# Patient Record
Sex: Male | Born: 1990 | Race: White | Hispanic: No | Marital: Married | Smoking: Former smoker
Health system: Southern US, Community
[De-identification: ages and names within clinical notes are randomized; demographics above are authoritative.]

## PROBLEM LIST (undated history)

## (undated) DIAGNOSIS — I8393 Asymptomatic varicose veins of bilateral lower extremities: Secondary | ICD-10-CM

## (undated) HISTORY — PX: APPENDECTOMY: SHX54

---
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
## 2010-06-30 ENCOUNTER — Observation Stay: Payer: Self-pay | Admitting: Surgery

## 2019-11-14 ENCOUNTER — Other Ambulatory Visit (INDEPENDENT_AMBULATORY_CARE_PROVIDER_SITE_OTHER): Payer: Self-pay | Admitting: Nurse Practitioner

## 2019-11-14 DIAGNOSIS — I83819 Varicose veins of unspecified lower extremities with pain: Secondary | ICD-10-CM

## 2019-11-22 ENCOUNTER — Ambulatory Visit (INDEPENDENT_AMBULATORY_CARE_PROVIDER_SITE_OTHER): Payer: BC Managed Care – PPO

## 2019-11-22 ENCOUNTER — Encounter (INDEPENDENT_AMBULATORY_CARE_PROVIDER_SITE_OTHER): Payer: Self-pay | Admitting: Nurse Practitioner

## 2019-11-22 ENCOUNTER — Other Ambulatory Visit: Payer: Self-pay

## 2019-11-22 ENCOUNTER — Ambulatory Visit (INDEPENDENT_AMBULATORY_CARE_PROVIDER_SITE_OTHER): Payer: BC Managed Care – PPO | Admitting: Nurse Practitioner

## 2019-11-22 VITALS — BP 110/73 | HR 92 | Ht 73.0 in | Wt 270.0 lb

## 2019-11-22 DIAGNOSIS — I83819 Varicose veins of unspecified lower extremities with pain: Secondary | ICD-10-CM | POA: Diagnosis not present

## 2019-11-23 ENCOUNTER — Encounter (INDEPENDENT_AMBULATORY_CARE_PROVIDER_SITE_OTHER): Payer: Self-pay | Admitting: Nurse Practitioner

## 2019-11-23 NOTE — Progress Notes (Signed)
Subjective:    Patient ID: Blake Perez, male    DOB: 19-Feb-1991, 29 y.o.   MRN: [id number] Chief Complaint  Patient presents with  . New Patient (Initial Visit)    Varicose Veins BLE VEN Reflux    The patient is seen for evaluation of symptomatic varicose veins.  The patient states that he has had them for about 9 or 10 years.  The patient relates burning and stinging which worsened steadily throughout the course of the day, particularly with standing. The patient also notes an aching and throbbing pain over the varicosities, particularly with prolonged dependent positions. The symptoms are significantly improved with elevation.  The patient also notes that during hot weather the symptoms are greatly intensified. The patient states the pain from the varicose veins interferes with work, daily exercise, shopping and household maintenance. At this point, the symptoms are persistent and severe enough that they're having a negative impact on lifestyle and are interfering with daily activities.  There is no history of DVT, PE or superficial thrombophlebitis. There is no history of ulceration or hemorrhage. The patient denies a significant family history of varicose veins.   The patient has not worn graduated compression in the past. At the present time the patient has not been using over-the-counter analgesics. There is no history of prior surgical intervention or sclerotherapy.  Today the patient underwent noninvasive studies that showed that the patient has no evidence of DVT or superficial venous thrombosis bilaterally.  The patient has no evidence of superficial venous reflux seen in the bilateral great saphenous veins or small saphenous veins.  The patient has no evidence of deep venous insufficiency bilaterally.  The varicose veins in the right leg laterally has no evidence of clot seen in the varicosities.   Review of Systems  Cardiovascular: Positive for leg swelling.       Tender  varicose veins  All other systems reviewed and are negative.      Objective:   Physical Exam Vitals reviewed.  Constitutional:      Appearance: Normal appearance.  HENT:     Head: Normocephalic.  Cardiovascular:     Rate and Rhythm: Normal rate and regular rhythm.     Pulses: Normal pulses.     Heart sounds: Normal heart sounds.     Comments: Prominent varicose veins especially on right lateral lower extremity extending from his proximal thigh to his proximal calf measures at least 3 to 4 mm Pulmonary:     Effort: Pulmonary effort is normal.     Breath sounds: Normal breath sounds.  Musculoskeletal:        General: Tenderness present.     Right lower leg: Edema present.     Left lower leg: Edema present.  Skin:    General: Skin is warm and dry.     Capillary Refill: Capillary refill takes less than 2 seconds.  Neurological:     Mental Status: He is alert and oriented to person, place, and time.  Psychiatric:        Mood and Affect: Mood normal.        Behavior: Behavior normal.        Thought Content: Thought content normal.        Judgment: Judgment normal.     BP 110/73   Pulse 92   Ht 6\' 1"  (1.854 m)   Wt 270 lb (122.5 kg)   BMI 35.62 kg/m   No past medical history on file.  Social History  Socioeconomic History  . Marital status: Single    Spouse name: Not on file  . Number of children: Not on file  . Years of education: Not on file  . Highest education level: Not on file  Occupational History  . Not on file  Tobacco Use  . Smoking status: Former Research scientist (life sciences)  . Smokeless tobacco: Former Network engineer and Sexual Activity  . Alcohol use: Not on file  . Drug use: Not on file  . Sexual activity: Not on file  Other Topics Concern  . Not on file  Social History Narrative  . Not on file   Social Determinants of Health   Financial Resource Strain:   . Difficulty of Paying Living Expenses:   Food Insecurity:   . Worried About Charity fundraiser in  the Last Year:   . Arboriculturist in the Last Year:   Transportation Needs:   . Film/video editor (Medical):   Marland Kitchen Lack of Transportation (Non-Medical):   Physical Activity:   . Days of Exercise per Week:   . Minutes of Exercise per Session:   Stress:   . Feeling of Stress :   Social Connections:   . Frequency of Communication with Friends and Family:   . Frequency of Social Gatherings with Friends and Family:   . Attends Religious Services:   . Active Member of Clubs or Organizations:   . Attends Archivist Meetings:   Marland Kitchen Marital Status:   Intimate Partner Violence:   . Fear of Current or Ex-Partner:   . Emotionally Abused:   Marland Kitchen Physically Abused:   . Sexually Abused:     History reviewed. No pertinent surgical history.  No family history on file.  Not on File     Assessment & Plan:   1. Varicose veins with pain Recommend:  The patient is complaining of varicose veins.    I have had a long discussion with the patient regarding  varicose veins and why they cause symptoms.  Patient will begin wearing graduated compression stockings on a daily basis, beginning first thing in the morning and removing them in the evening. The patient is instructed specifically not to sleep in the stockings.    The patient  will also begin using over-the-counter analgesics such as Motrin 600 mg po TID to help control the symptoms as needed.    In addition, behavioral modification including elevation during the day will be initiated, utilizing a recliner was recommended.  The patient is also instructed to continue exercising such as walking 4-5 times per week.  At this time the patient wishes to continue conservative therapy and based on noninvasive studies is not a candidate for endovenous laser ablation.  Sclerotherapy may be useful however we are unable to perform cosmetic sclerotherapy.  The Patient will follow up PRN if the symptoms worsen.   No current outpatient  medications on file prior to visit.   No current facility-administered medications on file prior to visit.    There are no Patient Instructions on file for this visit. No follow-ups on file.   Kris Hartmann, NP

## 2021-07-20 DIAGNOSIS — M5416 Radiculopathy, lumbar region: Secondary | ICD-10-CM

## 2021-07-20 DIAGNOSIS — M5126 Other intervertebral disc displacement, lumbar region: Secondary | ICD-10-CM

## 2021-07-20 HISTORY — DX: Radiculopathy, lumbar region: M54.16

## 2021-07-20 HISTORY — DX: Other intervertebral disc displacement, lumbar region: M51.26

## 2021-07-21 ENCOUNTER — Emergency Department
Admission: EM | Admit: 2021-07-21 | Discharge: 2021-07-21 | Disposition: A | Discharge status: Home / Self Care | Payer: BC Managed Care – PPO | Attending: Emergency Medicine | Admitting: Emergency Medicine

## 2021-07-21 ENCOUNTER — Other Ambulatory Visit: Payer: Self-pay

## 2021-07-21 ENCOUNTER — Encounter: Payer: Self-pay | Admitting: Emergency Medicine

## 2021-07-21 ENCOUNTER — Emergency Department: Payer: BC Managed Care – PPO

## 2021-07-21 DIAGNOSIS — M5441 Lumbago with sciatica, right side: Secondary | ICD-10-CM | POA: Insufficient documentation

## 2021-07-21 DIAGNOSIS — M545 Low back pain, unspecified: Secondary | ICD-10-CM | POA: Diagnosis present

## 2021-07-21 DIAGNOSIS — K5900 Constipation, unspecified: Secondary | ICD-10-CM | POA: Insufficient documentation

## 2021-07-21 MED ORDER — HYDROCODONE-ACETAMINOPHEN 5-325 MG PO TABS: 1.0000 | ORAL_TABLET | Freq: Four times a day (QID) | ORAL | 0 refills | Status: AC | PRN

## 2021-07-21 MED ORDER — PREDNISONE 10 MG (21) PO TBPK: ORAL_TABLET | ORAL | 0 refills | Status: DC

## 2021-07-21 MED ORDER — HYDROCODONE-ACETAMINOPHEN 5-325 MG PO TABS
2.0000 | ORAL_TABLET | Freq: Once | ORAL | Status: AC
  Administered 2021-07-21: 2 via ORAL
  Filled 2021-07-21: qty 2

## 2021-07-21 MED ORDER — ONDANSETRON HCL 4 MG PO TABS: 4.0000 mg | ORAL_TABLET | Freq: Three times a day (TID) | ORAL | 0 refills | Status: AC | PRN

## 2021-07-21 NOTE — Discharge Instructions (Signed)
Take tapered steroid as directed.  You can take Norco for pain with Zofran for nausea.  Please do not take cough syrup with codeine with Norco.  You can take Tessalon Perles with steroid.  Please make follow up appointment with Dr. Myer Haff.

## 2021-07-21 NOTE — ED Triage Notes (Signed)
Pt to ED via POV with c/o lower back pain that radiates down his R leg. Pt states R leg is numb and feels like pins and needles in his foot, feels like sharp pain in his R buttocks, denies loss of bowel and bladder at this time.

## 2021-07-21 NOTE — ED Provider Notes (Signed)
[facility] Provider Note  Patient Contact: 10:30 PM (approximate)   History   Back Pain   HPI  Blake Perez is a 31 y.o. male presents to the emergency department with low back pain.  Patient reports that a year ago he developed similar low back pain and had an MRI which showed disc herniation at L5-S1.  Patient states that he was given surgery as an option but decided to treat it conservatively with anti-inflammatories and physical therapy.  Patient reports that he has a very physically active job and 2 days ago engaged in some heavy lifting and felt resurgence of pain.  He is interested in following up with neurosurgery and is concerned that he may need an MRI.  He has symmetric leg strength and no bowel or bladder incontinence.  No saddle anesthesia.  Denies fever, chills, dysuria or hematuria.      Physical Exam   Triage Vital Signs: ED Triage Vitals  Enc Vitals Group     BP 07/21/21 1936 126/85     Pulse Rate 07/21/21 1936 (!) 115     Resp 07/21/21 1936 18     Temp 07/21/21 1936 98.3 F (36.8 C)     Temp Source 07/21/21 1936 Oral     SpO2 07/21/21 1936 99 %     Weight 07/21/21 1935 (!) 310 lb (140.6 kg)     Height 07/21/21 1935 6\' 1"  (1.854 m)     Head Circumference --      Peak Flow --      Pain Score 07/21/21 1945 8     Pain Loc --      Pain Edu? --      Excl. in [place]? --     Most recent vital signs: Vitals:   07/21/21 1936  BP: 126/85  Pulse: (!) 115  Resp: 18  Temp: 98.3 F (36.8 C)  SpO2: 99%     General: Alert and in no acute distress. Eyes:  PERRL. EOMI. Head: No acute traumatic findings ENT:      Ears:       Nose: No congestion/rhinnorhea.      Mouth/Throat: Mucous membranes are moist. Neck: No stridor. No cervical spine tenderness to palpation. Cardiovascular:  Good peripheral perfusion Respiratory: Normal respiratory effort without tachypnea or retractions. Lungs CTAB. Good air entry to the bases with no  decreased or absent breath sounds. Gastrointestinal: Bowel sounds 4 quadrants. Soft and nontender to palpation. No guarding or rigidity. No palpable masses. No distention. No CVA tenderness. Musculoskeletal: Patient has symmetric strength in the lower extremities.  Full range of motion to all extremities.  Neurologic:  No gross focal neurologic deficits are appreciated.  Skin:   No rash noted Other:   ED Results / Procedures / Treatments   Labs (all labs ordered are listed, but only abnormal results are displayed) Labs Reviewed - No data to display      MEDICATIONS ORDERED IN ED: Medications  HYDROcodone-acetaminophen (NORCO/VICODIN) 5-325 MG per tablet 2 tablet (2 tablets Oral Given 07/21/21 1950)     IMPRESSION / MDM / ASSESSMENT AND PLAN / ED COURSE  I reviewed the triage vital signs and the nursing notes.                              Differential diagnosis includes, but is not limited to, lumbar strain, herniated disc, muscle spasm  Assessment and plan Low back pain  31 year old male presents to the emergency department with acute on chronic low back pain.  Patient was tachycardic at triage but vital signs were otherwise reassuring.  Medical record was reviewed and patient has been seen for lumbar radiculopathy in the past.  Had extensive conversation with patient regarding obtaining a nonemergent MRI.  Explained to patient that MRI in the emergency department would be nonemergent as patient has symmetric leg strength and has no leg weakness or bowel or bladder incontinence.  Would prefer to follow-up with neurosurgery at this time.  He was discharged with taper prednisone and Norco for pain and advised to take stool softener to avoid constipation.  Return precautions were given to return to the emergency department with bowel or bladder incontinence, worsening pain or weakness.  Patient was understanding has easy access to the emergency department should symptoms change or  worsen.   FINAL CLINICAL IMPRESSION(S) / ED DIAGNOSES   Final diagnoses:  Acute bilateral low back pain with right-sided sciatica     Rx / DC Orders   ED Discharge Orders          Ordered    predniSONE (STERAPRED UNI-PAK 21 TAB) 10 MG (21) TBPK tablet        07/21/21 2157    HYDROcodone-acetaminophen (NORCO) 5-325 MG tablet  Every 6 hours PRN        07/21/21 2157    ondansetron (ZOFRAN) 4 MG tablet  Every 8 hours PRN        07/21/21 2157             Note:  This document was prepared using Dragon voice recognition software and may include unintentional dictation errors.   Vallarie Mare [place], PA-C 07/21/21 2241    Delman Kitten, MD 07/21/21 [phone]

## 2021-07-21 NOTE — ED Provider Notes (Signed)
°  Emergency Medicine Provider Triage Evaluation Note  Blake Perez , a 30 y.o.male,  was evaluated in triage.  Pt complains of low back pain.  Patient has a history of herniated disc and lumbar region.  2 days ago, he was lifting heavy furniture when he felt his back give out.  He is now currently endorsing pain in his lower back with numbness tingling this intermittently in his right leg.  Denies fever/chills, chest pain, abdominal pain, or shortness of breath   Review of Systems  Positive: Low back pain Negative: Denies fever, chest pain, vomiting  Physical Exam   Vitals:   07/21/21 1936  BP: 126/85  Pulse: (!) 115  Resp: 18  Temp: 98.3 F (36.8 C)  SpO2: 99%   Gen:   Awake, no distress   Resp:  Normal effort  MSK:   Moves extremities without difficulty  Other:    Medical Decision Making  Given the patient's initial medical screening exam, the following diagnostic evaluation has been ordered. The patient will be placed in the appropriate treatment space, once one is available, to complete the evaluation and treatment. I have discussed the plan of care with the patient and I have advised the patient that an ED physician or mid-level practitioner will reevaluate their condition after the test results have been received, as the results may give them additional insight into the type of treatment they may need.    Diagnostics: Lumbar x-ray  Treatments: none immediately   Andon, [place], PA 07/21/21 1946    Shaune Pollack, MD 07/22/21 [phone]

## 2021-08-06 ENCOUNTER — Other Ambulatory Visit: Payer: Self-pay | Admitting: Neurosurgery

## 2021-08-06 DIAGNOSIS — M5416 Radiculopathy, lumbar region: Secondary | ICD-10-CM

## 2021-08-15 ENCOUNTER — Ambulatory Visit
Admission: RE | Admit: 2021-08-15 | Discharge: 2021-08-15 | Disposition: A | Discharge status: Home / Self Care | Payer: BC Managed Care – PPO | Source: Ambulatory Visit | Attending: Neurosurgery | Admitting: Neurosurgery

## 2021-08-15 DIAGNOSIS — M5416 Radiculopathy, lumbar region: Secondary | ICD-10-CM | POA: Diagnosis not present

## 2021-10-16 ENCOUNTER — Other Ambulatory Visit: Payer: Self-pay | Admitting: Neurosurgery

## 2021-10-16 DIAGNOSIS — Z01818 Encounter for other preprocedural examination: Secondary | ICD-10-CM

## 2021-10-29 ENCOUNTER — Other Ambulatory Visit: Payer: BC Managed Care – PPO

## 2021-10-31 ENCOUNTER — Encounter: Payer: Self-pay | Admitting: Neurosurgery

## 2021-10-31 ENCOUNTER — Encounter
Admission: RE | Admit: 2021-10-31 | Discharge: 2021-10-31 | Disposition: A | Discharge status: Home / Self Care | Payer: BC Managed Care – PPO | Source: Ambulatory Visit | Attending: Neurosurgery | Admitting: Neurosurgery

## 2021-10-31 DIAGNOSIS — Z01812 Encounter for preprocedural laboratory examination: Secondary | ICD-10-CM | POA: Diagnosis not present

## 2021-10-31 LAB — URINALYSIS, ROUTINE W REFLEX MICROSCOPIC
Bilirubin Urine: NEGATIVE
Glucose, UA: NEGATIVE mg/dL
Hgb urine dipstick: NEGATIVE
Ketones, ur: NEGATIVE mg/dL
Leukocytes,Ua: NEGATIVE
Nitrite: NEGATIVE
Protein, ur: NEGATIVE mg/dL
Specific Gravity, Urine: 1.012 (ref 1.005–1.030)
pH: 6 (ref 5.0–8.0)

## 2021-10-31 LAB — TYPE AND SCREEN
ABO/RH(D): A POS
Antibody Screen: NEGATIVE

## 2021-10-31 LAB — BASIC METABOLIC PANEL
Anion gap: 6 (ref 5–15)
BUN: 11 mg/dL (ref 6–20)
CO2: 29 mmol/L (ref 22–32)
Calcium: 9.5 mg/dL (ref 8.9–10.3)
Chloride: 103 mmol/L (ref 98–111)
Creatinine, Ser: 1.09 mg/dL (ref 0.61–1.24)
GFR, Estimated: >60 mL/min (ref >60)
Glucose, Bld: 106 mg/dL — ABNORMAL HIGH (ref 70–99)
Potassium: 3.3 mmol/L — ABNORMAL LOW (ref 3.5–5.1)
Sodium: 138 mmol/L (ref 135–145)

## 2021-10-31 LAB — CBC
HCT: 46.4 % (ref 39.0–52.0)
Hemoglobin: 15.6 g/dL (ref 13.0–17.0)
MCH: 29 pg (ref 26.0–34.0)
MCHC: 33.6 g/dL (ref 30.0–36.0)
MCV: 86.2 fL (ref 80.0–100.0)
Platelets: 252 10*3/uL (ref 150–400)
RBC: 5.38 MIL/uL (ref 4.22–5.81)
RDW: 11.8 % (ref 11.5–15.5)
WBC: 4.9 10*3/uL (ref 4.0–10.5)
nRBC: 0 % (ref 0.0–0.2)

## 2021-10-31 LAB — SURGICAL PCR SCREEN
MRSA, PCR: NEGATIVE
Staphylococcus aureus: NEGATIVE

## 2021-10-31 NOTE — Patient Instructions (Addendum)
Your procedure is scheduled on: Monday, April 24 ?Report to the Registration Desk on the 1st floor of the [facility]. ?To find out your arrival time, please call [phone] between 1PM - 3PM on: Friday, April 21 ? ?REMEMBER: ?Instructions that are not followed completely may result in serious medical risk, up to and including death; or upon the discretion of your surgeon and anesthesiologist your surgery may need to be rescheduled. ? ?Do not eat food after midnight the night before surgery.  ?No gum chewing, lozengers or hard candies. ? ?You may however, drink CLEAR liquids up to 2 hours before you are scheduled to arrive for your surgery. Do not drink anything within 2 hours of your scheduled arrival time. ? ?Clear liquids include: ?- water  ?- apple juice without pulp ?- gatorade (not RED colors) ?- black coffee or tea (Do NOT add milk or creamers to the coffee or tea) ?Do NOT drink anything that is not on this list. ? ?DO NOT TAKE ANY MEDICATIONS THE MORNING OF SURGERY  ? ?One week prior to surgery: starting April 17 ?Stop ASPIRIN and Anti-inflammatories (NSAIDS) such as Advil, Aleve, Ibuprofen, Motrin, Naproxen, Naprosyn and Aspirin based products such as Excedrin, Goodys Powder, BC Powder. ?Stop ANY OVER THE COUNTER supplements until after surgery. ?You may however, continue to take Tylenol if needed for pain up until the day of surgery. ? ?No Alcohol for 24 hours before or after surgery. ? ?No Smoking including e-cigarettes for 24 hours prior to surgery.  ?No chewable tobacco products for at least 6 hours prior to surgery.  ?No nicotine patches on the day of surgery. ? ?Do not use any "recreational" drugs for at least a week prior to your surgery.  ?Please be advised that the combination of cocaine and anesthesia may have negative outcomes, up to and including death. ?If you test positive for cocaine, your surgery will be cancelled. ? ?On the morning of surgery brush your teeth with toothpaste and water,  you may rinse your mouth with mouthwash if you wish. ?Do not swallow any toothpaste or mouthwash. ? ?Use CHG Soap as directed on instruction sheet. ? ?Do not wear jewelry, make-up, hairpins, clips or nail polish. ? ?Do not wear lotions, powders, or perfumes.  ? ?Do not shave body from the neck down 48 hours prior to surgery just in case you cut yourself which could leave a site for infection.  ?Also, freshly shaved skin may become irritated if using the CHG soap. ? ?Contact lenses, hearing aids and dentures may not be worn into surgery. ? ?Do not bring valuables to the hospital. [facility] is not responsible for any missing/lost belongings or valuables.  ? ?Notify your doctor if there is any change in your medical condition (cold, fever, infection). ? ?Wear comfortable clothing (specific to your surgery type) to the hospital. ? ?After surgery, you can help prevent lung complications by doing breathing exercises.  ?Take deep breaths and cough every 1-2 hours. Your doctor may order a device called an Incentive Spirometer to help you take deep breaths. ? ?If you are being discharged the day of surgery, you will not be allowed to drive home. ?You will need a responsible adult (18 years or older) to drive you home and stay with you that night.  ? ?If you are taking public transportation, you will need to have a responsible adult (18 years or older) with you. ?Please confirm with your physician that it is acceptable to use public transportation.  ? ?  Please call the Pre-admissions Testing Dept. at [phone] if you have any questions about these instructions. ? ?Surgery Visitation Policy: ? ?Patients undergoing a surgery or procedure may have two family members or support persons with them as long as the person is not COVID-19 positive or experiencing its symptoms.  ?

## 2021-11-06 NOTE — Anesthesia Preprocedure Evaluation (Addendum)
Anesthesia Evaluation  ?Patient identified by MRN, date of birth, ID band ?Patient awake ? ? ? ?Reviewed: ?Allergy & Precautions, NPO status , Patient's Chart, lab work & pertinent test results ? ?History of Anesthesia Complications ?Negative for: history of anesthetic complications ? ?Airway ?Mallampati: III ? ?TM Distance: >3 FB ?Neck ROM: full ? ? ? Dental ? ?(+) Chipped ?  ?Pulmonary ?neg shortness of breath, sleep apnea , former smoker,  ?  ?Pulmonary exam normal ? ? ? ? ? ? ? Cardiovascular ?(-) angina(-) Past MI negative cardio ROS ?Normal cardiovascular exam ? ? ?  ?Neuro/Psych ?Lumbar disc herniation ? Neuromuscular disease negative psych ROS  ? GI/Hepatic ?negative GI ROS, Neg liver ROS, neg GERD  ,  ?Endo/Other  ?negative endocrine ROS ? Renal/GU ?  ? ?  ?Musculoskeletal ? ? Abdominal ?  ?Peds ? Hematology ?negative hematology ROS ?(+)   ?Anesthesia Other Findings ?Past Medical History: ?07/2021: Lumbar disc herniation ?    Comment:  L5-S1 ?07/2021: Right lumbar radiculopathy ?No date: Varicose veins of both lower extremities ? ?Past Surgical History: ?2010: APPENDECTOMY ? ? ? ? Reproductive/Obstetrics ?negative OB ROS ? ?  ? ? ? ? ? ? ? ? ? ? ? ? ? ?  ?  ? ? ? ? ? ? ? ?Anesthesia Physical ?Anesthesia Plan ? ?ASA: 2 ? ?Anesthesia Plan: General ETT  ? ?Post-op Pain Management: Gabapentin PO (pre-op)*  ? ?Induction: Intravenous ? ?PONV Risk Score and Plan: Ondansetron, Dexamethasone, Midazolam and Treatment may vary due to age or medical condition ? ?Airway Management Planned: Oral ETT ? ?Additional Equipment:  ? ?Intra-op Plan:  ? ?Post-operative Plan: Extubation in OR ? ?Informed Consent: I have reviewed the patients History and Physical, chart, labs and discussed the procedure including the risks, benefits and alternatives for the proposed anesthesia with the patient or authorized representative who has indicated his/her understanding and acceptance.  ? ? ? ?Dental  Advisory Given ? ?Plan Discussed with: Anesthesiologist, CRNA and Surgeon ? ?Anesthesia Plan Comments: (Patient consented for risks of anesthesia including but not limited to:  ?- adverse reactions to medications ?- damage to eyes, teeth, lips or other oral mucosa ?- nerve damage due to positioning  ?- sore throat or hoarseness ?- Damage to heart, brain, nerves, lungs, other parts of body or loss of life ? ?Patient voiced understanding.)  ? ? ? ? ? ?Anesthesia Quick Evaluation ? ?

## 2021-11-10 ENCOUNTER — Encounter
Admission: RE | Disposition: A | Discharge status: Home / Self Care | Payer: Self-pay | Source: Home / Self Care | Attending: Neurosurgery

## 2021-11-10 ENCOUNTER — Ambulatory Visit
Admission: RE | Admit: 2021-11-10 | Discharge: 2021-11-10 | Disposition: A | Discharge status: Home / Self Care | Payer: BC Managed Care – PPO | Attending: Neurosurgery | Admitting: Neurosurgery

## 2021-11-10 ENCOUNTER — Ambulatory Visit: Payer: BC Managed Care – PPO

## 2021-11-10 ENCOUNTER — Encounter: Payer: Self-pay | Admitting: Neurosurgery

## 2021-11-10 ENCOUNTER — Other Ambulatory Visit: Payer: Self-pay

## 2021-11-10 ENCOUNTER — Ambulatory Visit: Payer: BC Managed Care – PPO | Admitting: Anesthesiology

## 2021-11-10 DIAGNOSIS — M5117 Intervertebral disc disorders with radiculopathy, lumbosacral region: Secondary | ICD-10-CM | POA: Diagnosis present

## 2021-11-10 DIAGNOSIS — Z01812 Encounter for preprocedural laboratory examination: Secondary | ICD-10-CM

## 2021-11-10 HISTORY — DX: Asymptomatic varicose veins of bilateral lower extremities: I83.93

## 2021-11-10 HISTORY — PX: LUMBAR LAMINECTOMY/DECOMPRESSION MICRODISCECTOMY: SHX5026

## 2021-11-10 LAB — POCT I-STAT, CHEM 8
BUN: 9 mg/dL (ref 6–20)
Calcium, Ion: 1.22 mmol/L (ref 1.15–1.40)
Chloride: 106 mmol/L (ref 98–111)
Creatinine, Ser: 1.1 mg/dL (ref 0.61–1.24)
Glucose, Bld: 105 mg/dL — ABNORMAL HIGH (ref 70–99)
HCT: 45 % (ref 39.0–52.0)
Hemoglobin: 15.3 g/dL (ref 13.0–17.0)
Potassium: 3.8 mmol/L (ref 3.5–5.1)
Sodium: 142 mmol/L (ref 135–145)
TCO2: 24 mmol/L (ref 22–32)

## 2021-11-10 LAB — ABO/RH: ABO/RH(D): A POS

## 2021-11-10 SURGERY — LUMBAR LAMINECTOMY/DECOMPRESSION MICRODISCECTOMY 1 LEVEL
Anesthesia: General | Site: Back | Laterality: Right

## 2021-11-10 MED ORDER — CEFAZOLIN IN SODIUM CHLORIDE 3-0.9 GM/100ML-% IV SOLN
3.0000 g | INTRAVENOUS | Status: AC
  Administered 2021-11-10: 2 g via INTRAVENOUS
  Filled 2021-11-10: qty 100

## 2021-11-10 MED ORDER — BUPIVACAINE LIPOSOME 1.3 % IJ SUSP
INTRAMUSCULAR | Status: AC
  Filled 2021-11-10: qty 20

## 2021-11-10 MED ORDER — PROMETHAZINE HCL 25 MG/ML IJ SOLN: 6.2500 mg | INTRAMUSCULAR | Status: DC | PRN

## 2021-11-10 MED ORDER — METHOCARBAMOL 500 MG PO TABS: 500.0000 mg | ORAL_TABLET | Freq: Four times a day (QID) | ORAL | 0 refills | Status: DC

## 2021-11-10 MED ORDER — GABAPENTIN 300 MG PO CAPS
300.0000 mg | ORAL_CAPSULE | Freq: Once | ORAL | Status: AC
Start: 2021-11-10 — End: 2021-11-10

## 2021-11-10 MED ORDER — BUPIVACAINE-EPINEPHRINE (PF) 0.5% -1:200000 IJ SOLN
INTRAMUSCULAR | Status: AC
Start: 2021-11-10 — End: ?
  Filled 2021-11-10: qty 30

## 2021-11-10 MED ORDER — CHLORHEXIDINE GLUCONATE 0.12 % MT SOLN
OROMUCOSAL | Status: AC
  Administered 2021-11-10: 15 mL via OROMUCOSAL
  Filled 2021-11-10: qty 15

## 2021-11-10 MED ORDER — PROPOFOL 10 MG/ML IV BOLUS
INTRAVENOUS | Status: AC
  Filled 2021-11-10: qty 40

## 2021-11-10 MED ORDER — SUCCINYLCHOLINE CHLORIDE 200 MG/10ML IV SOSY
PREFILLED_SYRINGE | INTRAVENOUS | Status: AC
  Filled 2021-11-10: qty 10

## 2021-11-10 MED ORDER — DEXAMETHASONE SODIUM PHOSPHATE 10 MG/ML IJ SOLN
INTRAMUSCULAR | Status: AC
  Filled 2021-11-10: qty 1

## 2021-11-10 MED ORDER — METHYLPREDNISOLONE ACETATE 40 MG/ML IJ SUSP
INTRAMUSCULAR | Status: AC
Start: 2021-11-10 — End: ?
  Filled 2021-11-10: qty 1

## 2021-11-10 MED ORDER — OXYCODONE-ACETAMINOPHEN 5-325 MG PO TABS: 1.0000 | ORAL_TABLET | ORAL | 0 refills | Status: AC | PRN

## 2021-11-10 MED ORDER — FENTANYL CITRATE (PF) 100 MCG/2ML IJ SOLN
INTRAMUSCULAR | Status: DC | PRN
  Administered 2021-11-10: 50 ug via INTRAVENOUS

## 2021-11-10 MED ORDER — LIDOCAINE HCL (PF) 2 % IJ SOLN
INTRAMUSCULAR | Status: AC
  Filled 2021-11-10: qty 5

## 2021-11-10 MED ORDER — FENTANYL CITRATE (PF) 100 MCG/2ML IJ SOLN: 25.0000 ug | INTRAMUSCULAR | Status: DC | PRN

## 2021-11-10 MED ORDER — SURGIFLO WITH THROMBIN (HEMOSTATIC MATRIX KIT) OPTIME
TOPICAL | Status: DC | PRN
  Administered 2021-11-10: 1 via TOPICAL

## 2021-11-10 MED ORDER — REMIFENTANIL HCL 1 MG IV SOLR: INTRAVENOUS | Status: DC | PRN

## 2021-11-10 MED ORDER — SODIUM CHLORIDE (PF) 0.9 % IJ SOLN
INTRAMUSCULAR | Status: DC | PRN
  Administered 2021-11-10: 60 mL via INTRAMUSCULAR

## 2021-11-10 MED ORDER — BUPIVACAINE-EPINEPHRINE (PF) 0.5% -1:200000 IJ SOLN
INTRAMUSCULAR | Status: DC | PRN
  Administered 2021-11-10: 10 mL

## 2021-11-10 MED ORDER — ACETAMINOPHEN 500 MG PO TABS
1000.0000 mg | ORAL_TABLET | Freq: Once | ORAL | Status: AC
Start: 2021-11-10 — End: 2021-11-10

## 2021-11-10 MED ORDER — ONDANSETRON HCL 4 MG/2ML IJ SOLN
INTRAMUSCULAR | Status: DC | PRN
  Administered 2021-11-10: 4 mg via INTRAVENOUS

## 2021-11-10 MED ORDER — CHLORHEXIDINE GLUCONATE 0.12 % MT SOLN: 15.0000 mL | Freq: Once | OROMUCOSAL | Status: AC

## 2021-11-10 MED ORDER — MIDAZOLAM HCL 2 MG/2ML IJ SOLN
INTRAMUSCULAR | Status: AC
  Filled 2021-11-10: qty 2

## 2021-11-10 MED ORDER — FAMOTIDINE 20 MG PO TABS
ORAL_TABLET | ORAL | Status: AC
  Administered 2021-11-10: 20 mg via ORAL
  Filled 2021-11-10: qty 1

## 2021-11-10 MED ORDER — SODIUM CHLORIDE FLUSH 0.9 % IV SOLN
INTRAVENOUS | Status: AC
  Filled 2021-11-10: qty 20

## 2021-11-10 MED ORDER — GABAPENTIN 300 MG PO CAPS
ORAL_CAPSULE | ORAL | Status: AC
  Administered 2021-11-10: 300 mg via ORAL
  Filled 2021-11-10: qty 1

## 2021-11-10 MED ORDER — REMIFENTANIL HCL 1 MG IV SOLR
INTRAVENOUS | Status: DC | PRN
  Administered 2021-11-10: .1 ug/kg/min via INTRAVENOUS

## 2021-11-10 MED ORDER — PHENYLEPHRINE HCL-NACL 20-0.9 MG/250ML-% IV SOLN
INTRAVENOUS | Status: DC | PRN
  Administered 2021-11-10: 45 ug/min via INTRAVENOUS

## 2021-11-10 MED ORDER — ORAL CARE MOUTH RINSE: 15.0000 mL | Freq: Once | OROMUCOSAL | Status: AC

## 2021-11-10 MED ORDER — PHENYLEPHRINE HCL (PRESSORS) 10 MG/ML IV SOLN
INTRAVENOUS | Status: DC | PRN
  Administered 2021-11-10: 80 ug via INTRAVENOUS

## 2021-11-10 MED ORDER — MIDAZOLAM HCL 2 MG/2ML IJ SOLN
INTRAMUSCULAR | Status: DC | PRN
  Administered 2021-11-10: 2 mg via INTRAVENOUS

## 2021-11-10 MED ORDER — METHYLPREDNISOLONE ACETATE 40 MG/ML IJ SUSP
INTRAMUSCULAR | Status: DC | PRN
  Administered 2021-11-10: 40 mg

## 2021-11-10 MED ORDER — OXYCODONE HCL 5 MG/5ML PO SOLN: 5.0000 mg | Freq: Once | ORAL | Status: DC | PRN

## 2021-11-10 MED ORDER — 0.9 % SODIUM CHLORIDE (POUR BTL) OPTIME
TOPICAL | Status: DC | PRN
  Administered 2021-11-10: 1000 mL

## 2021-11-10 MED ORDER — SUCCINYLCHOLINE CHLORIDE 200 MG/10ML IV SOSY
PREFILLED_SYRINGE | INTRAVENOUS | Status: DC | PRN
Start: 2021-11-10 — End: 2021-11-10
  Administered 2021-11-10: 140 mg via INTRAVENOUS

## 2021-11-10 MED ORDER — LIDOCAINE HCL (CARDIAC) PF 100 MG/5ML IV SOSY
PREFILLED_SYRINGE | INTRAVENOUS | Status: DC | PRN
  Administered 2021-11-10: 50 mg via INTRAVENOUS

## 2021-11-10 MED ORDER — PHENYLEPHRINE HCL (PRESSORS) 10 MG/ML IV SOLN
INTRAVENOUS | Status: AC
  Filled 2021-11-10: qty 1

## 2021-11-10 MED ORDER — LACTATED RINGERS IV SOLN: INTRAVENOUS | Status: DC

## 2021-11-10 MED ORDER — DROPERIDOL 2.5 MG/ML IJ SOLN: 0.6250 mg | Freq: Once | INTRAMUSCULAR | Status: DC | PRN

## 2021-11-10 MED ORDER — OXYCODONE HCL 5 MG PO TABS: 5.0000 mg | ORAL_TABLET | Freq: Once | ORAL | Status: DC | PRN

## 2021-11-10 MED ORDER — SENNA 8.6 MG PO TABS: 1.0000 | ORAL_TABLET | Freq: Every day | ORAL | 0 refills | Status: DC | PRN

## 2021-11-10 MED ORDER — ACETAMINOPHEN 500 MG PO TABS
ORAL_TABLET | ORAL | Status: AC
  Administered 2021-11-10: 1000 mg via ORAL
  Filled 2021-11-10: qty 2

## 2021-11-10 MED ORDER — ACETAMINOPHEN 10 MG/ML IV SOLN: 1000.0000 mg | Freq: Once | INTRAVENOUS | Status: DC | PRN

## 2021-11-10 MED ORDER — ONDANSETRON HCL 4 MG/2ML IJ SOLN
INTRAMUSCULAR | Status: AC
  Filled 2021-11-10: qty 2

## 2021-11-10 MED ORDER — FENTANYL CITRATE (PF) 100 MCG/2ML IJ SOLN
INTRAMUSCULAR | Status: AC
  Filled 2021-11-10: qty 2

## 2021-11-10 MED ORDER — PROPOFOL 10 MG/ML IV BOLUS
INTRAVENOUS | Status: DC | PRN
  Administered 2021-11-10: 200 mg via INTRAVENOUS

## 2021-11-10 MED ORDER — REMIFENTANIL HCL 1 MG IV SOLR
INTRAVENOUS | Status: AC
Start: 2021-11-10 — End: ?
  Filled 2021-11-10: qty 1000

## 2021-11-10 MED ORDER — FAMOTIDINE 20 MG PO TABS: 20.0000 mg | ORAL_TABLET | Freq: Once | ORAL | Status: AC

## 2021-11-10 MED ORDER — BUPIVACAINE HCL (PF) 0.5 % IJ SOLN
INTRAMUSCULAR | Status: AC
  Filled 2021-11-10: qty 30

## 2021-11-10 MED ORDER — DEXAMETHASONE SODIUM PHOSPHATE 10 MG/ML IJ SOLN
INTRAMUSCULAR | Status: DC | PRN
  Administered 2021-11-10: 10 mg via INTRAVENOUS

## 2021-11-10 SURGICAL SUPPLY — 58 items
ADH SKN CLS APL DERMABOND .7 (GAUZE/BANDAGES/DRESSINGS) ×1
AGENT HMST KT MTR STRL THRMB (HEMOSTASIS) ×1
APL PRP STRL LF DISP 70% ISPRP (MISCELLANEOUS) ×3
BUR NEURO DRILL SOFT 3.0X3.8M (BURR) ×2 IMPLANT
CHLORAPREP W/TINT 26 (MISCELLANEOUS) ×5 IMPLANT
CNTNR SPEC 2.5X3XGRAD LEK (MISCELLANEOUS) ×1
CONT SPEC 4OZ STER OR WHT (MISCELLANEOUS) ×1
CONT SPEC 4OZ STRL OR WHT (MISCELLANEOUS) ×1
CONTAINER SPEC 2.5X3XGRAD LEK (MISCELLANEOUS) ×1 IMPLANT
COUNTER NEEDLE 20/40 LG (NEEDLE) ×2 IMPLANT
CUP MEDICINE 2OZ PLAST GRAD ST (MISCELLANEOUS) ×4 IMPLANT
DERMABOND ADVANCED (GAUZE/BANDAGES/DRESSINGS) ×1
DERMABOND ADVANCED .7 DNX12 (GAUZE/BANDAGES/DRESSINGS) ×1 IMPLANT
DRAPE C ARM PK CFD 31 SPINE (DRAPES) ×2 IMPLANT
DRAPE LAPAROTOMY 100X77 ABD (DRAPES) ×2 IMPLANT
DRAPE MICROSCOPE SPINE 48X150 (DRAPES) ×2 IMPLANT
DRAPE SURG 17X11 SM STRL (DRAPES) ×2 IMPLANT
DRSG OPSITE POSTOP 3X4 (GAUZE/BANDAGES/DRESSINGS) ×1 IMPLANT
ELECT CAUTERY BLADE TIP 2.5 (TIP) ×2
ELECT EZSTD 165MM 6.5IN (MISCELLANEOUS) ×2
ELECT REM PT RETURN 9FT ADLT (ELECTROSURGICAL) ×2
ELECTRODE CAUTERY BLDE TIP 2.5 (TIP) ×1 IMPLANT
ELECTRODE EZSTD 165MM 6.5IN (MISCELLANEOUS) IMPLANT
ELECTRODE REM PT RTRN 9FT ADLT (ELECTROSURGICAL) ×1 IMPLANT
GAUZE 4X4 16PLY ~~LOC~~+RFID DBL (SPONGE) ×1 IMPLANT
GLOVE BIOGEL PI IND STRL 6.5 (GLOVE) ×1 IMPLANT
GLOVE BIOGEL PI INDICATOR 6.5 (GLOVE) ×1
GLOVE SURG SYN 6.5 ES PF (GLOVE) ×4 IMPLANT
GLOVE SURG SYN 6.5 PF PI (GLOVE) ×2 IMPLANT
GLOVE SURG SYN 8.5  E (GLOVE) ×3
GLOVE SURG SYN 8.5 E (GLOVE) ×3 IMPLANT
GLOVE SURG SYN 8.5 PF PI (GLOVE) ×3 IMPLANT
GOWN SRG LRG LVL 4 IMPRV REINF (GOWNS) ×1 IMPLANT
GOWN SRG XL LVL 3 NONREINFORCE (GOWNS) ×1 IMPLANT
GOWN STRL NON-REIN TWL XL LVL3 (GOWNS) ×2
GOWN STRL REIN LRG LVL4 (GOWNS) ×2
GRADUATE 1200CC STRL 31836 (MISCELLANEOUS) ×2 IMPLANT
KIT SPINAL PRONEVIEW (KITS) ×2 IMPLANT
MANIFOLD NEPTUNE II (INSTRUMENTS) ×2 IMPLANT
MARKER SKIN DUAL TIP RULER LAB (MISCELLANEOUS) ×4 IMPLANT
NDL SAFETY ECLIPSE 18X1.5 (NEEDLE) ×1 IMPLANT
NEEDLE HYPO 18GX1.5 SHARP (NEEDLE) ×2
NEEDLE HYPO 22GX1.5 SAFETY (NEEDLE) ×2 IMPLANT
NS IRRIG 1000ML POUR BTL (IV SOLUTION) ×2 IMPLANT
PACK LAMINECTOMY NEURO (CUSTOM PROCEDURE TRAY) ×2 IMPLANT
PAD ARMBOARD 7.5X6 YLW CONV (MISCELLANEOUS) ×2 IMPLANT
SPONGE T-LAP 4X18 ~~LOC~~+RFID (SPONGE) IMPLANT
SURGIFLO W/THROMBIN 8M KIT (HEMOSTASIS) ×2 IMPLANT
SUT DVC VLOC 3-0 CL 6 P-12 (SUTURE) ×2 IMPLANT
SUT VIC AB 0 CT1 27 (SUTURE) ×2
SUT VIC AB 0 CT1 27XCR 8 STRN (SUTURE) ×1 IMPLANT
SUT VIC AB 2-0 CT1 18 (SUTURE) ×2 IMPLANT
SYR 10ML LL (SYRINGE) ×2 IMPLANT
SYR 20ML LL LF (SYRINGE) ×2 IMPLANT
SYR 30ML LL (SYRINGE) ×4 IMPLANT
SYR 3ML LL SCALE MARK (SYRINGE) ×2 IMPLANT
TOWEL OR 17X26 4PK STRL BLUE (TOWEL DISPOSABLE) ×6 IMPLANT
TUBING CONNECTING 10 (TUBING) ×3 IMPLANT

## 2021-11-10 NOTE — Discharge Summary (Signed)
Physician Discharge Summary  ?Patient ID: ?Blake Perez ?MRN: [id number] ?DOB/AGE: Apr 27, 1991 31 y.o. ? ?Admit date: 11/10/2021 ?Discharge date: 11/10/2021 ? ?Admission Diagnoses: Lumbar radiculopathy  ? ?Discharge Diagnoses:  ?Active Problems: ?  * No active hospital problems. * ? ? ?Discharged Condition: good ? ?Hospital Course:  ?Blake Perez is a 31 y.o s/p right L5-S1 microdiscectomy. His interoperative course was uncomplicated. He was monitored in PACU and discharged home after ambulating, urinating, and tolerating PO intake. He was given prescriptions for Percocet, Robaxin, and Senna.  ? ?Consults: None ? ?Significant Diagnostic Studies: none  ? ?Treatments: surgery: as above. Please see separately dictated operative report for further details ? ?Discharge Exam: ?Blood pressure (!) 128/96, pulse 90, temperature 97.8 ?F (36.6 ?C), temperature source Oral, resp. rate 16, height 6\' 1"  (1.854 m), weight 127.9 kg, SpO2 96 %. ? ?Oriented x 3 ?5/5 throughout BLE ?Incision c/d/I with clean dressing in place.  ? ?Disposition: Discharge disposition: 01-Home or Self Care ? ? ? ? ? ? ?Discharge Instructions   ? ? Remove dressing in 48 hours   Complete by: As directed ?  ? ?  ? ?Allergies as of 11/10/2021   ?No Known Allergies ?  ? ?  ?Medication List  ?  ? ?STOP taking these medications   ? ?[employer] Body Pain Ex [employer] 500-32.5 MG Tabs ?Generic drug: Aspirin-Caffeine ?  ? ?  ? ?TAKE these medications   ? ?methocarbamol 500 MG tablet ?Commonly known as: Robaxin ?Take 1 tablet (500 mg total) by mouth 4 (four) times daily. ?  ?oxyCODONE-acetaminophen 5-325 MG tablet ?Commonly known as: Percocet ?Take 1 tablet by mouth every 4 (four) hours as needed for up to 5 days for severe pain. ?  ?senna 8.6 MG Tabs tablet ?Commonly known as: SENOKOT ?Take 1 tablet (8.6 mg total) by mouth daily as needed for mild constipation. ?  ? ?  ? ? Follow-up Information   ? ? 11/12/2021, PA Follow up on 11/25/2021.   ?Why: For incison  check and post-op follow up; Tuesday May 9th @ 3:30pm. ?Contact information: ?[address] ?[place] ?[phone] ? ? ?  ?  ? ?  ?  ? ?  ? ? ?Signed: ?[phone] ?11/10/2021, 11:00 AM ? ? ?

## 2021-11-10 NOTE — H&P (Signed)
I have reviewed and confirmed my history and physical from 10/16/21 with no additions or changes. Plan for R L5/S1 microdiscectomy..  Risks and benefits reviewed. ? ?Heart sounds normal no MRG. Chest Clear to Auscultation Bilaterally. ? ? ?  ? ?

## 2021-11-10 NOTE — Op Note (Signed)
Indications: Mr. Holtsclaw is a 31 yo male who presented with: ?m54.16 lumbar radiculopathy ? ?He failed conservative management prompting surgical intervention. ? ?Findings: disc herniation L5-S1 ? ?Preoperative Diagnosis: m54.16 lumbar radiculopathy ?Postoperative Diagnosis: same ? ?EBL: 10 ml ?IVF: see AR ml ?Drains: none ?Disposition: Extubated and Stable to PACU ?Complications: none ? ?No foley catheter was placed. ? ? ?Preoperative Note:  ? ?Risks of surgery discussed include: infection, bleeding, stroke, coma, death, paralysis, CSF leak, nerve/spinal cord injury, numbness, tingling, weakness, complex regional pain syndrome, recurrent stenosis and/or disc herniation, vascular injury, development of instability, neck/back pain, need for further surgery, persistent symptoms, development of deformity, and the risks of anesthesia. The patient understood these risks and agreed to proceed. ? ?Operative Note:  ? ?1) Right L5/S1 microdiscectomy ? ?The patient was then brought from the preoperative center with intravenous access established.  The patient underwent general anesthesia and endotracheal tube intubation, and was then rotated on the [place] rail top where all pressure points were appropriately padded.  The skin was then thoroughly cleansed.  Perioperative antibiotic prophylaxis was administered.  Sterile prep and drapes were then applied and a timeout was then observed.  C-arm was brought into the field under sterile conditions, and the L5-S1 disc space identified and marked with an incision on the right 1cm lateral to midline. ? ?Once this was complete a 4 cm incision was opened with the use of a #10 blade knife.  The Metrx tubes were sequentially advanced under lateral fluoroscopy until a 18 x 80 mm Metrx tube was placed over the facet and lamina and secured to the bed.  The level was double counted due to difficulty with visualization through his ilium and pelvis.  AP counting with a rudimentary rib at L1  confirmed L5-S1 localization. ? ?The microscope was then sterilely brought into the field and muscle creep was hemostased with a bipolar and resected with a pituitary rongeur.  A Bovie extender was then used to expose the spinous process and lamina.  Careful attention was placed to not violate the facet capsule. A 3 mm matchstick drill bit was then used to make a hemi-laminotomy trough until the ligamentum flavum was exposed.  This was extended to the base of the spinous process.  Once this was complete and the underlying ligamentum flavum was visualized this was dissected with an up angle curette and resected with a #2 and #3 mm biting Kerrison.  The laminotomy opening was also expanded in similar fashion and hemostasis was obtained with Surgifoam and a patty as well as bone wax.  The rostral aspect of the caudal level of the lamina was also resected with a #2 biting Kerrison effort to further enhance exposure.  Once the underlying dura was visualized a Penfield 4 was then used to dissect and expose the traversing nerve root.  Once this was identified a nerve root retractor suction was used to mobilize this medially.  The venous plexus was hemostased with Surgifoam and light bipolar use.  A small penfied was then used to make a small annulotomy within the disc space and disc space contents were noted to come through the annulus.   ? ?The disc herniation was identified and dissected free using a balltip probe. The pituitary rongeur was used to remove the extruded disc fragments. Once the thecal sac and nerve root were noted to be relaxed and under less tension the ball-tipped feeler was passed along the foramen distally to to ensure no residual compression was noted.   ? ?  Depo-Medrol was placed along the nerve root.  The area was irrigated. The tube system was then removed under microscopic visualization and hemostasis was obtained with a bipolar.   ? ?The fascial layer was reapproximated with the use of a 0- Vicryl  suture.  Subcutaneous tissue layer was reapproximated using 2-0 Vicryl suture.  3-0 monocryl was used on the skin. The skin was then cleansed and Dermabond was used to close the skin opening.  Patient was then rotated back to the preoperative bed awakened from anesthesia and taken to recovery all counts are correct in this case. ? ? ?I performed the entire procedure with the assistance of Manning Charity PA as an Designer, television/film set. ? ?Venetia Night MD ? ?

## 2021-11-10 NOTE — Discharge Instructions (Addendum)
AMBULATORY SURGERY  ?DISCHARGE INSTRUCTIONS ? ? ?The drugs that you were given will stay in your system until tomorrow so for the next 24 hours you should not: ? ?Drive an automobile ?Make any legal decisions ?Drink any alcoholic beverage ? ? ?You may resume regular meals tomorrow.  Today it is better to start with liquids and gradually work up to solid foods. ? ?You may eat anything you prefer, but it is better to start with liquids, then soup and crackers, and gradually work up to solid foods. ? ? ?Please notify your doctor immediately if you have any unusual bleeding, trouble breathing, redness and pain at the surgery site, drainage, fever, or pain not relieved by medication. ? ? ? Information for Discharge Teaching: DO NOT REMOVE TEAL BRACELET FOR 4 days ( 96 hours) on 11/14/2021 ?EXPAREL (bupivacaine liposome injectable suspension)  ? ?Your surgeon or anesthesiologist gave you EXPAREL(bupivacaine) to help control your pain after surgery.  ?EXPAREL is a local anesthetic that provides pain relief by numbing the tissue around the surgical site. ?EXPAREL is designed to release pain medication over time and can control pain for up to 72 hours. ?Depending on how you respond to EXPAREL, you may require less pain medication during your recovery. ? ?Possible side effects: ?Temporary loss of sensation or ability to move in the area where bupivacaine was injected. ?Nausea, vomiting, constipation ?Rarely, numbness and tingling in your mouth or lips, lightheadedness, or anxiety may occur. ?Call your doctor right away if you think you may be experiencing any of these sensations, or if you have other questions regarding possible side effects. ? ?Follow all other discharge instructions given to you by your surgeon or nurse. Eat a healthy diet and drink plenty of water or other fluids. ? ?If you return to the hospital for any reason within 96 hours following the administration of EXPAREL, it is important for health care  providers to know that you have received this anesthetic. A teal colored band has been placed on your arm with the date, time and amount of EXPAREL you have received in order to alert and inform your health care providers. Please leave this armband in place for the full 96 hours following administration, and then you may remove the band.  ? ? ? ?Please contact your physician with any problems or Same Day Surgery at [phone], Monday through Friday 6 am to 4 pm, or [place] [facility] number at [phone].  ? ? ?Your surgeon has performed an operation on your lumbar spine (low back) to relieve pressure on one or more nerves. Many times, patients feel better immediately after surgery and can ?overdo it.? Even if you feel well, it is important that you follow these activity guidelines. If you do not let your back heal properly from the surgery, you can increase the chance of a disc herniation and/or return of your symptoms. The following are instructions to help in your recovery once you have been discharged from the hospital. ? ? ?Activity  ?  ?No bending, lifting, or twisting (?BLT?). Avoid lifting objects heavier than 10 pounds (gallon milk jug).  Where possible, avoid household activities that involve lifting, bending, pushing, or pulling such as laundry, vacuuming, grocery shopping, and childcare. Try to arrange for help from friends and family for these activities while your back heals. ? ?Increase physical activity slowly as tolerated.  Taking short walks is encouraged, but avoid strenuous exercise. Do not jog, run, bicycle, lift weights, or participate in any  other exercises unless specifically allowed by your doctor. Avoid prolonged sitting, including car rides. ? ?Talk to your doctor before resuming sexual activity. ? ?You should not drive until cleared by your doctor. ? ?Until released by your doctor, you should not return to work or school.  You should rest at home and let your body heal.   ? ?You may shower two days after your surgery.  After showering, lightly dab your incision dry. Do not take a tub bath or go swimming for 3 weeks, or until approved by your doctor at your follow-up appointment. ? ?If you smoke, we strongly recommend that you quit.  Smoking has been proven to interfere with normal healing in your back and will dramatically reduce the success rate of your surgery. Please contact [facility] (800-QUIT-NOW) and use the resources at www.[facility].com for assistance in stopping smoking. ? ?Surgical Incision ?  ?Keep your incision area clean and dry. ? ?Your incision was closed with Dermabond glue. The glue should begin to peel away within about a week. ? ?Diet          ? ? You may return to your usual diet. Be sure to stay hydrated. ? ?When to Contact us ? ?Although your surgery and recovery will likely be uneventful, you may have some residual numbness, aches, and pains in your back and/or legs. This is normal and should improve in the next few weeks. ? ?However, should you experience any of the following, contact us immediately: ?New numbness or weakness ?Pain that is progressively getting worse, and is not relieved by your pain medications or rest ?Bleeding, redness, swelling, pain, or drainage from surgical incision ?Chills or flu-like symptoms ?Fever greater than 101.0 F (38.3 C) ?Problems with bowel or bladder functions ?Difficulty breathing or shortness of breath ?Warmth, tenderness, or swelling in your calf ? ?Contact Information ?During office hours (Monday-Friday 9 am to 5 pm), please call your physician at [phone] ?After hours and weekends, please call [phone] and speak with the answering service, who will contact the doctor on call.  If that fails, call the [facility] Operator at [phone] and ask for the Neurosurgery Resident On Call  ?For a life-threatening emergency, call 911  ?

## 2021-11-10 NOTE — Anesthesia Procedure Notes (Signed)
Procedure Name: Intubation ?Date/Time: 11/10/2021 7:30 AM ?Performed by: Berniece Pap, CRNA ?Pre-anesthesia Checklist: Patient identified, Emergency Drugs available, Suction available and Patient being monitored ?Patient Re-evaluated:Patient Re-evaluated prior to induction ?Oxygen Delivery Method: Circle system utilized ?Preoxygenation: Pre-oxygenation with 100% oxygen ?Induction Type: IV induction ?Ventilation: Mask ventilation without difficulty ?Laryngoscope Size: 4 and McGraph ?Tube type: Oral ?Tube size: 7.5 mm ?Number of attempts: 1 ?Airway Equipment and Method: Stylet and Oral airway ?Placement Confirmation: ETT inserted through vocal cords under direct vision, positive ETCO2 and breath sounds checked- equal and bilateral ?Secured at: 21 cm ?Tube secured with: Tape ?Dental Injury: Teeth and Oropharynx as per pre-operative assessment  ? ? ? ? ?

## 2021-11-10 NOTE — Anesthesia Postprocedure Evaluation (Signed)
Anesthesia Post Note ? ?Patient: Blake Perez ? ?Procedure(s) Performed: RIGHT L5-S1 MICRODISCECTOMY (Right: Back) ? ?Patient location during evaluation: PACU ?Anesthesia Type: General ?Level of consciousness: awake and alert ?Pain management: pain level controlled ?Vital Signs Assessment: post-procedure vital signs reviewed and stable ?Respiratory status: spontaneous breathing, nonlabored ventilation and respiratory function stable ?Cardiovascular status: blood pressure returned to baseline and stable ?Postop Assessment: no apparent nausea or vomiting ?Anesthetic complications: no ? ? ?No notable events documented. ? ? ?Last Vitals:  ?Vitals:  ? 11/10/21 0948 11/10/21 0958  ?BP: 119/77 (!) 128/96  ?Pulse: 81 90  ?Resp: 16 16  ?Temp: 36.9 ?C 36.6 ?C  ?SpO2: 94% 96%  ?  ?Last Pain:  ?Vitals:  ? 11/10/21 0958  ?TempSrc: Oral  ?PainSc: 0-No pain  ? ? ?  ?  ?  ?  ?  ?  ? ?[place] Ouch ? ? ? ? ?

## 2021-11-10 NOTE — Progress Notes (Signed)
PHARMACY -  BRIEF ANTIBIOTIC NOTE  ? ?Pharmacy has received consult(s) for Cefazolin from an OR provider.  The patient's profile has been reviewed for ht/wt/allergies/indication/available labs.   ? ?One time order(s) placed for Cefazolin 3 gm per pt wt: 132 kg ? ?Further antibiotics/pharmacy consults should be ordered by admitting physician if indicated.       ?                ?Thank you, ?Otelia Sergeant, PharmD, MBA ?11/10/2021 ?12:34 AM ? ?

## 2021-11-10 NOTE — Transfer of Care (Signed)
Immediate Anesthesia Transfer of Care Note ? ?Patient: Blake Perez ? ?Procedure(s) Performed: RIGHT L5-S1 MICRODISCECTOMY (Right: Back) ? ?Patient Location: PACU ? ?Anesthesia Type:General ? ?Level of Consciousness: drowsy ? ?Airway & Oxygen Therapy: Patient Spontanous Breathing and Patient connected to face mask oxygen ? ?Post-op Assessment: Report given to RN and Post -op Vital signs reviewed and stable ? ?Post vital signs: Reviewed and stable ? ?Last Vitals:  ?Vitals Value Taken Time  ?BP 121/74 11/10/21 0909  ?Temp    ?Pulse 84 11/10/21 0912  ?Resp 13 11/10/21 0912  ?SpO2 100 % 11/10/21 0912  ?Vitals shown include unvalidated device data. ? ?Last Pain:  ?Vitals:  ? 11/10/21 0645  ?TempSrc: Tympanic  ?PainSc: 4   ?   ? ?  ? ?Complications: No notable events documented. ?

## 2021-11-11 ENCOUNTER — Encounter: Payer: Self-pay | Admitting: Neurosurgery

## 2022-05-18 ENCOUNTER — Encounter (INDEPENDENT_AMBULATORY_CARE_PROVIDER_SITE_OTHER): Payer: Self-pay

## 2022-11-03 ENCOUNTER — Ambulatory Visit (INDEPENDENT_AMBULATORY_CARE_PROVIDER_SITE_OTHER): Payer: BC Managed Care – PPO | Admitting: Internal Medicine

## 2022-11-03 ENCOUNTER — Encounter: Payer: Self-pay | Admitting: Internal Medicine

## 2022-11-03 VITALS — BP 126/72 | HR 90 | Temp 97.1°F | Ht 73.0 in | Wt 323.0 lb

## 2022-11-03 DIAGNOSIS — Z136 Encounter for screening for cardiovascular disorders: Secondary | ICD-10-CM

## 2022-11-03 DIAGNOSIS — Z833 Family history of diabetes mellitus: Secondary | ICD-10-CM

## 2022-11-03 DIAGNOSIS — E6609 Other obesity due to excess calories: Secondary | ICD-10-CM | POA: Insufficient documentation

## 2022-11-03 DIAGNOSIS — Z1159 Encounter for screening for other viral diseases: Secondary | ICD-10-CM

## 2022-11-03 DIAGNOSIS — Z1329 Encounter for screening for other suspected endocrine disorder: Secondary | ICD-10-CM | POA: Diagnosis not present

## 2022-11-03 DIAGNOSIS — M5440 Lumbago with sciatica, unspecified side: Secondary | ICD-10-CM

## 2022-11-03 DIAGNOSIS — Z23 Encounter for immunization: Secondary | ICD-10-CM

## 2022-11-03 DIAGNOSIS — Z6841 Body Mass Index (BMI) 40.0 and over, adult: Secondary | ICD-10-CM

## 2022-11-03 DIAGNOSIS — Z114 Encounter for screening for human immunodeficiency virus [HIV]: Secondary | ICD-10-CM

## 2022-11-03 DIAGNOSIS — Z0001 Encounter for general adult medical examination with abnormal findings: Secondary | ICD-10-CM

## 2022-11-03 DIAGNOSIS — E66812 Obesity, class 2: Secondary | ICD-10-CM | POA: Insufficient documentation

## 2022-11-03 DIAGNOSIS — G8929 Other chronic pain: Secondary | ICD-10-CM | POA: Insufficient documentation

## 2022-11-03 NOTE — Assessment & Plan Note (Signed)
Encourage diet and exercise for weight loss 

## 2022-11-03 NOTE — Assessment & Plan Note (Signed)
Encourage weight loss as this can help reduce back pain Encouraged regular stretching and core strengthening

## 2022-11-03 NOTE — Patient Instructions (Signed)
Health Maintenance, Male Adopting a healthy lifestyle and getting preventive care are important in promoting health and wellness. Ask your health care provider about: The right schedule for you to have regular tests and exams. Things you can do on your own to prevent diseases and keep yourself healthy. What should I know about diet, weight, and exercise? Eat a healthy diet  Eat a diet that includes plenty of vegetables, fruits, low-fat dairy products, and lean protein. Do not eat a lot of foods that are high in solid fats, added sugars, or sodium. Maintain a healthy weight Body mass index (BMI) is a measurement that can be used to identify possible weight problems. It estimates body fat based on height and weight. Your health care provider can help determine your BMI and help you achieve or maintain a healthy weight. Get regular exercise Get regular exercise. This is one of the most important things you can do for your health. Most adults should: Exercise for at least 150 minutes each week. The exercise should increase your heart rate and make you sweat (moderate-intensity exercise). Do strengthening exercises at least twice a week. This is in addition to the moderate-intensity exercise. Spend less time sitting. Even light physical activity can be beneficial. Watch cholesterol and blood lipids Have your blood tested for lipids and cholesterol at 33 years of age, then have this test every 5 years. You may need to have your cholesterol levels checked more often if: Your lipid or cholesterol levels are high. You are older than 33 years of age. You are at high risk for heart disease. What should I know about cancer screening? Many types of cancers can be detected early and may often be prevented. Depending on your health history and family history, you may need to have cancer screening at various ages. This may include screening for: Colorectal cancer. Prostate cancer. Skin cancer. Lung  cancer. What should I know about heart disease, diabetes, and high blood pressure? Blood pressure and heart disease High blood pressure causes heart disease and increases the risk of stroke. This is more likely to develop in people who have high blood pressure readings or are overweight. Talk with your health care provider about your target blood pressure readings. Have your blood pressure checked: Every 3-5 years if you are 18-39 years of age. Every year if you are 40 years old or older. If you are between the ages of 65 and 75 and are a current or former smoker, ask your health care provider if you should have a one-time screening for abdominal aortic aneurysm (AAA). Diabetes Have regular diabetes screenings. This checks your fasting blood sugar level. Have the screening done: Once every three years after age 45 if you are at a normal weight and have a low risk for diabetes. More often and at a younger age if you are overweight or have a high risk for diabetes. What should I know about preventing infection? Hepatitis B If you have a higher risk for hepatitis B, you should be screened for this virus. Talk with your health care provider to find out if you are at risk for hepatitis B infection. Hepatitis C Blood testing is recommended for: Everyone born from 1945 through 1965. Anyone with known risk factors for hepatitis C. Sexually transmitted infections (STIs) You should be screened each year for STIs, including gonorrhea and chlamydia, if: You are sexually active and are younger than 33 years of age. You are older than 33 years of age and your   health care provider tells you that you are at risk for this type of infection. Your sexual activity has changed since you were last screened, and you are at increased risk for chlamydia or gonorrhea. Ask your health care provider if you are at risk. Ask your health care provider about whether you are at high risk for HIV. Your health care provider  may recommend a prescription medicine to help prevent HIV infection. If you choose to take medicine to prevent HIV, you should first get tested for HIV. You should then be tested every 3 months for as long as you are taking the medicine. Follow these instructions at home: Alcohol use Do not drink alcohol if your health care provider tells you not to drink. If you drink alcohol: Limit how much you have to 0-2 drinks a day. Know how much alcohol is in your drink. In the [place] one drink equals one 12 oz bottle of beer (355 mL), one 5 oz glass of wine (148 mL), or one 1 oz glass of hard liquor (44 mL). Lifestyle Do not use any products that contain nicotine or tobacco. These products include cigarettes, chewing tobacco, and vaping devices, such as e-cigarettes. If you need help quitting, ask your health care provider. Do not use street drugs. Do not share needles. Ask your health care provider for help if you need support or information about quitting drugs. General instructions Schedule regular health, dental, and eye exams. Stay current with your vaccines. Tell your health care provider if: You often feel depressed. You have ever been abused or do not feel safe at home. Summary Adopting a healthy lifestyle and getting preventive care are important in promoting health and wellness. Follow your health care provider's instructions about healthy diet, exercising, and getting tested or screened for diseases. Follow your health care provider's instructions on monitoring your cholesterol and blood pressure. This information is not intended to replace advice given to you by your health care provider. Make sure you discuss any questions you have with your health care provider. Document Revised: 11/25/2020 Document Reviewed: 11/25/2020 [employer] Patient Education  2023 [employer]  

## 2022-11-03 NOTE — Progress Notes (Signed)
HPI  Patient presents to clinic today to establish care and for management of the conditions listed below. He would like his annual exam today.  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  He is not taking Methocarbamol anymore. He follows with neurosurgery.  Flu: never Tetanus: > 10 years ago Covid: never Dentist: as needed  Diet: He does eat meat. He consumes fruits and veggies. He tries to avoid fried foods. He drinks mostly Zero Sugar Soda. 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 120 tablet 0   senna (SENOKOT) 8.6 MG TABS tablet Take 1 tablet (8.6 mg total) by mouth daily as needed for mild constipation. 30 tablet 0   No current facility-administered medications for this visit.    No Known Allergies  Family History  Problem Relation Age of Onset   Healthy Mother    Cerebral aneurysm Father    Alcohol abuse Father    Scoliosis Sister    Healthy Brother    Healthy Brother    Healthy Brother    Diabetes Maternal Grandfather    Osteoarthritis Paternal Grandmother        Back issues    Social History   Socioeconomic History   Marital status: Single    Spouse name: Not on file   Number of children: 0   Years of education: Not on file   Highest education level: Not on file  Occupational History   Not on file  Tobacco Use   Smoking status: Former    Types: Cigarettes    Quit date: 2015    Years since quitting: 9.2   Smokeless tobacco: Former    Types: Snuff  Vaping Use   Vaping Use: Former  Substance and Sexual Activity   Alcohol use: Yes    Comment: occassional   Drug use: Never   Sexual activity: Yes  Other Topics Concern    Not on file  Social History Narrative   Lives with girlfriend   Social Determinants of Health   Financial Resource Strain: Not on file  Food Insecurity: Not on file  Transportation Needs: Not on file  Physical Activity: Not on file  Stress: Not on file  Social Connections: Not on file  Intimate Partner Violence: Not on file    ROS:  Constitutional: Denies fever, malaise, fatigue, headache or abrupt weight changes.  HEENT: Denies eye pain, eye redness, ear pain, ringing in the ears, wax buildup, runny nose, nasal congestion, bloody nose, or sore throat. Respiratory: Denies difficulty breathing, shortness of breath, cough or sputum production.   Cardiovascular: Denies chest pain, chest tightness, palpitations or swelling in the hands or feet.  Gastrointestinal: Denies abdominal pain, bloating, constipation, diarrhea or blood in the stool.  GU: Denies frequency, urgency, pain with urination, blood in urine, odor or discharge. Musculoskeletal: Pt reports intermittent back pain. Denies decrease in range of motion, difficulty with gait, or joint swelling.  Skin: Denies redness, rashes, lesions or ulcercations.  Neurological: Pt reports intermittent lightheadedness. Denies dizziness, difficulty with memory, difficulty with speech or problems with balance and coordination.  Psych: Denies anxiety, depression, SI/HI.  No other specific complaints in a complete review of  systems (except as listed in HPI above).  PE:  BP 126/72 (BP Location: Left Arm, Patient Position: Sitting, Cuff Size: Large)   Pulse 90   Temp (!) 97.1 F (36.2 C) (Temporal)   Ht  (1.854 m)   Wt (!) 323 lb (146.5 kg)   SpO2 100%   BMI 42.61 kg/m  Wt Readings from Last 3 Encounters:  11/03/22 (!) 323 lb (146.5 kg)  11/10/21 282 lb (127.9 kg)  10/31/21 291 lb (132 kg)    General: Appears his stated age, obese, in NAD. HEENT: Head: normal shape and size; Eyes: sclera white, no icterus, conjunctiva pink,  PERRLA and EOMs intact;  Neck: Neck supple, trachea midline. No masses, lumps or thyromegaly present.  Cardiovascular: Normal rate and rhythm. S1,S2 noted.  No murmur, rubs or gallops noted. No JVD or BLE edema.  Varicose veins noted of BLE, R >L. Pulmonary/Chest: Normal effort and positive vesicular breath sounds. No respiratory distress. No wheezes, rales or ronchi noted.  Abdomen: Normal bowel sounds. Musculoskeletal: Strength 5/5 BUE/BLE. No difficulty with gait.  Neurological: Alert and oriented. Cranial nerves II-XII grossly intact. Coordination normal.  Psychiatric: Mood and affect normal. Behavior is normal. Judgment and thought content normal.     BMET    Component Value Date/Time   NA 142 11/10/2021 0707   K 3.8 11/10/2021 0707   CL 106 11/10/2021 0707   CO2 29 10/31/2021 1228   GLUCOSE 105 (H) 11/10/2021 0707   BUN 9 11/10/2021 0707   CREATININE 1.10 11/10/2021 0707   CALCIUM 9.5 10/31/2021 1228   GFRNONAA >60 10/31/2021 1228    Lipid Panel  No results found for: "CHOL", "TRIG", "HDL", "CHOLHDL", "VLDL", "LDLCALC"  CBC    Component Value Date/Time   WBC 4.9 10/31/2021 1228   RBC 5.38 10/31/2021 1228   HGB 15.3 11/10/2021 0707   HCT 45.0 11/10/2021 0707   PLT 252 10/31/2021 1228   MCV 86.2 10/31/2021 1228   MCH 29.0 10/31/2021 1228   MCHC 33.6 10/31/2021 1228   RDW 11.8 10/31/2021 1228    Hgb A1C No results found for: "HGBA1C"   Assessment and Plan:  Preventative Health Maintenance:  Encouraged him to get a flu shot the fall Tdap today Encouraged him to get his COVID-vaccine Encouraged him to consume a balanced diet and exercise regimen Advised him to see dentist annually We will check CBC, c-Met, TSH, lipid, A1c, HIV and hep C today  RTC in 1 year, sooner if needed Nicki Reaper, NP

## 2022-11-04 LAB — COMPLETE METABOLIC PANEL WITH GFR
AG Ratio: 2.4 (calc) (ref 1.0–2.5)
ALT: 34 U/L (ref 9–46)
AST: 26 U/L (ref 10–40)
Albumin: 4.6 g/dL (ref 3.6–5.1)
Alkaline phosphatase (APISO): 53 U/L (ref 36–130)
BUN: 16 mg/dL (ref 7–25)
CO2: 27 mmol/L (ref 20–32)
Calcium: 9.2 mg/dL (ref 8.6–10.3)
Chloride: 104 mmol/L (ref 98–110)
Creat: 1.04 mg/dL (ref 0.60–1.26)
Globulin: 1.9 g/dL (calc) (ref 1.9–3.7)
Glucose, Bld: 94 mg/dL (ref 65–99)
Potassium: 4.6 mmol/L (ref 3.5–5.3)
Sodium: 138 mmol/L (ref 135–146)
Total Bilirubin: 0.6 mg/dL (ref 0.2–1.2)
Total Protein: 6.5 g/dL (ref 6.1–8.1)
eGFR: 98 mL/min/{1.73_m2} (ref >=60)

## 2022-11-04 LAB — CBC
HCT: 47.5 % (ref 38.5–50.0)
Hemoglobin: 16 g/dL (ref 13.2–17.1)
MCH: 29.7 pg (ref 27.0–33.0)
MCHC: 33.7 g/dL (ref 32.0–36.0)
MCV: 88.3 fL (ref 80.0–100.0)
MPV: 8.8 fL (ref 7.5–12.5)
Platelets: 249 10*3/uL (ref 140–400)
RBC: 5.38 10*6/uL (ref 4.20–5.80)
RDW: 12.5 % (ref 11.0–15.0)
WBC: 4.8 10*3/uL (ref 3.8–10.8)

## 2022-11-04 LAB — HEMOGLOBIN A1C
Hgb A1c MFr Bld: 6 % of total Hgb — ABNORMAL HIGH (ref <5.7)
Mean Plasma Glucose: 126 mg/dL
eAG (mmol/L): 7 mmol/L

## 2022-11-04 LAB — LIPID PANEL
Cholesterol: 138 mg/dL (ref <200)
HDL: 65 mg/dL (ref >=40)
LDL Cholesterol (Calc): 57 mg/dL (calc)
Non-HDL Cholesterol (Calc): 73 mg/dL (calc) (ref <130)
Total CHOL/HDL Ratio: 2.1 (calc) (ref <5.0)
Triglycerides: 75 mg/dL (ref <150)

## 2022-11-04 LAB — TSH: TSH: 1.63 mIU/L (ref 0.40–4.50)

## 2022-11-04 LAB — HIV ANTIBODY (ROUTINE TESTING W REFLEX): HIV 1&2 Ab, 4th Generation: NONREACTIVE

## 2022-11-04 LAB — HEPATITIS C ANTIBODY: Hepatitis C Ab: NONREACTIVE

## 2022-11-06 ENCOUNTER — Encounter: Payer: Self-pay | Admitting: Internal Medicine

## 2022-11-09 MED ORDER — PHENTERMINE HCL 15 MG PO CAPS
15.0000 mg | ORAL_CAPSULE | ORAL | 0 refills | Status: DC
Start: 2022-11-09 — End: 2022-12-18

## 2022-11-24 ENCOUNTER — Ambulatory Visit: Payer: Self-pay

## 2022-11-24 ENCOUNTER — Encounter: Payer: Self-pay | Admitting: Family Medicine

## 2022-11-24 ENCOUNTER — Ambulatory Visit: Payer: BC Managed Care – PPO | Admitting: Family Medicine

## 2022-11-24 VITALS — BP 104/80 | HR 103 | Temp 98.8°F | Ht 73.0 in | Wt 308.0 lb

## 2022-11-24 DIAGNOSIS — J029 Acute pharyngitis, unspecified: Secondary | ICD-10-CM

## 2022-11-24 MED ORDER — AMOXICILLIN 500 MG PO CAPS: 500.0000 mg | ORAL_CAPSULE | Freq: Two times a day (BID) | ORAL | 0 refills | Status: DC

## 2022-11-24 MED ORDER — IBUPROFEN 600 MG PO TABS: 600.0000 mg | ORAL_TABLET | Freq: Three times a day (TID) | ORAL | 0 refills | Status: DC | PRN

## 2022-11-24 NOTE — Patient Instructions (Addendum)
Thank you for coming to the office today.  We will cover you for Strep Throat with antibiotics, take Amoxicillin 500mg  twice a day for 10 days. We will send swab for a culture, and notify you next week if we need to change antibiotics.  - Take regular NSAID with either Aleve 1-2 pills twice a day OR Ibuprofen 400 to 600mg  per dose with food every 6-8 hours or 3 times a day for next 3 to 5 days regularly, then as needed only - Take Tylenol 500-1000mg  per dose as needed every 8 hours or 3 times a day between for pain, fevers, or chills - Drink extra clear fluids (water, or G2 gatorade), try colder soft foods if needed otherwise regular diet - Drink warm herbal tea with honey for sore throat   There is a chance that this is more of a Viral Pharyngitis, in which case it will take time to run it's course regardless, and may have some hoarseness or throat pain lasting for up to 7-10 days then improve  Please schedule a Follow-up Appointment to: No follow-ups on file.  If you have any other questions or concerns, please feel free to call the office or send a message through MyChart. You may also schedule an earlier appointment if necessary.  Additionally, you may be receiving a survey about your experience at our office within a few days to 1 week by e-mail or mail. We value your feedback.  Saralyn Pilar, DO [facility], [place]

## 2022-11-24 NOTE — Progress Notes (Signed)
Subjective:    Patient ID: Blake Perez, male    DOB: [birth year], 32 y.o.   MRN: [id number]  Blake Perez is a 32 y.o. male presenting on 11/24/2022 for Sore Throat (Started 3 days ago, started after mowing the grass) and Ear Pain (Has taken ibuprofen)  Patient presents for a same day appointment.  PCP Nicki Reaper, FNP   HPI  Acute Pharyngitis   Reports symptoms started Saturday. He mowed the yard Friday. Initially raspy hoarse voice and sore throat. He had had uvula swelling. Pain to touch under neck/jaw. Also bilateral ear pain pressure  No sick contact or strep exposure.  Tried Ibuprofen 200mg  x 3 = 600mg  twice a day, some relief. and Vick's numbing spray. It is effective to help  Denies any cough congestion respiratory symptoms Denies any fevers chills Denies any nausea vomiting diarrhea      11/24/2022    4:22 PM 11/03/2022   10:50 AM  Depression screen PHQ 2/9  Decreased Interest 1 0  Down, Depressed, Hopeless 0 0  PHQ - 2 Score 1 0    Social History   Tobacco Use   Smoking status: Former    Types: Cigarettes    Quit date: 2015    Years since quitting: 9.3   Smokeless tobacco: Former    Types: Snuff  Vaping Use   Vaping Use: Former  Substance Use Topics   Alcohol use: Yes    Comment: occassional   Drug use: Never    Review of Systems Per HPI unless specifically indicated above     Objective:    BP 104/80   Pulse (!) 103   Temp 98.8 F (37.1 C) (Oral)   Ht 6\' 1"  (1.854 m)   Wt (!) 308 lb (139.7 kg)   SpO2 99%   BMI 40.64 kg/m   Wt Readings from Last 3 Encounters:  11/24/22 (!) 308 lb (139.7 kg)  11/03/22 (!) 323 lb (146.5 kg)  11/10/21 282 lb (127.9 kg)    Physical Exam Vitals and nursing note reviewed.  Constitutional:      General: He is not in acute distress.    Appearance: He is well-developed. He is not diaphoretic.     Comments: Well-appearing, comfortable, cooperative  HENT:     Head: Normocephalic and atraumatic.   Eyes:     General:        Right eye: No discharge.        Left eye: No discharge.     Conjunctiva/sclera: Conjunctivae normal.  Neck:     Thyroid: No thyromegaly.  Cardiovascular:     Rate and Rhythm: Normal rate and regular rhythm.     Pulses: Normal pulses.     Heart sounds: Normal heart sounds. No murmur heard. Pulmonary:     Effort: Pulmonary effort is normal. No respiratory distress.     Breath sounds: Normal breath sounds. No wheezing or rales.  Musculoskeletal:        General: Normal range of motion.     Cervical back: Normal range of motion and neck supple.  Lymphadenopathy:     Cervical: No cervical adenopathy.  Skin:    General: Skin is warm and dry.     Findings: No erythema or rash.  Neurological:     Mental Status: He is alert and oriented to person, place, and time. Mental status is at baseline.  Psychiatric:        Behavior: Behavior normal.     Comments: Well  groomed, good eye contact, normal speech and thoughts    Results for orders placed or performed in visit on 11/03/22  CBC  Result Value Ref Range   WBC 4.8 3.8 - 10.8 Thousand/uL   RBC 5.38 4.20 - 5.80 Million/uL   Hemoglobin 16.0 13.2 - 17.1 g/dL   HCT 09.8 11.9 - 14.7 %   MCV 88.3 80.0 - 100.0 fL   MCH 29.7 27.0 - 33.0 pg   MCHC 33.7 32.0 - 36.0 g/dL   RDW 82.9 56.2 - 13.0 %   Platelets 249 140 - 400 Thousand/uL   MPV 8.8 7.5 - 12.5 fL  COMPLETE METABOLIC PANEL WITH GFR  Result Value Ref Range   Glucose, Bld 94 65 - 99 mg/dL   BUN 16 7 - 25 mg/dL   Creat 8.65 7.84 - 6.96 mg/dL   eGFR 98 > OR = 60 EX/BMW/4.13K4   BUN/Creatinine Ratio SEE NOTE: 6 - 22 (calc)   Sodium 138 135 - 146 mmol/L   Potassium 4.6 3.5 - 5.3 mmol/L   Chloride 104 98 - 110 mmol/L   CO2 27 20 - 32 mmol/L   Calcium 9.2 8.6 - 10.3 mg/dL   Total Protein 6.5 6.1 - 8.1 g/dL   Albumin 4.6 3.6 - 5.1 g/dL   Globulin 1.9 1.9 - 3.7 g/dL (calc)   AG Ratio 2.4 1.0 - 2.5 (calc)   Total Bilirubin 0.6 0.2 - 1.2 mg/dL   Alkaline  phosphatase (APISO) 53 36 - 130 U/L   AST 26 10 - 40 U/L   ALT 34 9 - 46 U/L  TSH  Result Value Ref Range   TSH 1.63 0.40 - 4.50 mIU/L  Lipid panel  Result Value Ref Range   Cholesterol 138 <200 mg/dL   HDL 65 > OR = 40 mg/dL   Triglycerides 75 <401 mg/dL   LDL Cholesterol (Calc) 57 mg/dL (calc)   Total CHOL/HDL Ratio 2.1 <5.0 (calc)   Non-HDL Cholesterol (Calc) 73 <027 mg/dL (calc)  Hemoglobin O5D  Result Value Ref Range   Hgb A1c MFr Bld 6.0 (H) <5.7 % of total Hgb   Mean Plasma Glucose 126 mg/dL   eAG (mmol/L) 7.0 mmol/L  HIV Antibody (routine testing w rflx)  Result Value Ref Range   HIV 1&2 Ab, 4th Generation NON-REACTIVE NON-REACTIVE  Hepatitis C antibody  Result Value Ref Range   Hepatitis C Ab NON-REACTIVE NON-REACTIVE      Assessment & Plan:   Problem List Items Addressed This Visit   None Visit Diagnoses     Acute pharyngitis, unspecified etiology    -  Primary   Relevant Medications   amoxicillin (AMOXIL) 500 MG capsule   ibuprofen (ADVIL) 600 MG tablet       Consistent with acute pharyngitis, concern for possible strep vs tonsillitis given history acute pharyngitis without viral prodrome, vs lack of cough, tender anterior cervical LAD, appearance of posterior pharynx.  No known strep or sick contacts.   Plan:  Start empiric antibiotic with Amoxicillin 500mg  BID x 10 days, will adjust based on improvement and culture results  Symptomatic control with NSAID / Tylenol regularly then AS NEEDED - Rx Ibuprofen 600mg  Improve hydration Follow-up within 1 week if worsening    Meds ordered this encounter  Medications   amoxicillin (AMOXIL) 500 MG capsule    Sig: Take 1 capsule (500 mg total) by mouth 2 (two) times daily. For 10 days    Dispense:  20 capsule    Refill:  0   ibuprofen (ADVIL) 600 MG tablet    Sig: Take 1 tablet (600 mg total) by mouth every 8 (eight) hours as needed for moderate pain or mild pain.    Dispense:  30 tablet    Refill:  0       Follow up plan: Return if symptoms worsen or fail to improve.  Saralyn Pilar, DO [facility] 11/24/2022, 4:42 PM

## 2022-11-24 NOTE — Telephone Encounter (Signed)
  Chief Complaint: sore throat Symptoms: sore throat, swelling in throat and lymph nodes, hurts to swallow, ear pain Frequency: Saturday Pertinent Negatives: Patient denies cough or runny nose or fever Disposition: [] ED /[] Urgent Care (no appt availability in office) / [x] Appointment(In office/virtual)/ []  [facility]/ [] Home Care/ [] Refused Recommended Disposition /[] [place]/ []  Follow-up with PCP Additional Notes: pt hasn't had strep in 10+ years. Scheduled OV today at 1600 with Dr. Kirtland Bouchard.  Reason for Disposition  Earache also present  Answer Assessment - Initial Assessment Questions 1. ONSET: "When did the throat start hurting?" (Hours or days ago)      Saturday  2. SEVERITY: "How bad is the sore throat?" (Scale 1-10; mild, moderate or severe)   - MILD (1-3):  Doesn't interfere with eating or normal activities.   - MODERATE (4-7): Interferes with eating some solids and normal activities.   - SEVERE (8-10):  Excruciating pain, interferes with most normal activities.   - SEVERE WITH DYSPHAGIA (10): Can't swallow liquids, drooling.     Moderate  3. STREP EXPOSURE: "Has there been any exposure to strep within the past week?" If Yes, ask: "What type of contact occurred?"      no 4.  VIRAL SYMPTOMS: "Are there any symptoms of a cold, such as a runny nose, cough, hoarse voice or red eyes?"      no 5. FEVER: "Do you have a fever?" If Yes, ask: "What is your temperature, how was it measured, and when did it start?"     no 6. PUS ON THE TONSILS: "Is there pus on the tonsils in the back of your throat?"     no 7. OTHER SYMPTOMS: "Do you have any other symptoms?" (e.g., difficulty breathing, headache, rash)     Swelling in bad of throat and lymph nodes  Protocols used: Sore Throat-A-AH

## 2022-12-18 ENCOUNTER — Ambulatory Visit: Payer: BC Managed Care – PPO | Admitting: Internal Medicine

## 2022-12-18 VITALS — BP 114/72 | HR 80 | Temp 96.6°F | Ht 73.0 in | Wt 301.0 lb

## 2022-12-18 DIAGNOSIS — Z6839 Body mass index (BMI) 39.0-39.9, adult: Secondary | ICD-10-CM

## 2022-12-18 DIAGNOSIS — E6609 Other obesity due to excess calories: Secondary | ICD-10-CM

## 2022-12-18 DIAGNOSIS — R7303 Prediabetes: Secondary | ICD-10-CM | POA: Diagnosis not present

## 2022-12-18 MED ORDER — PHENTERMINE HCL 15 MG PO CAPS: 15.0000 mg | ORAL_CAPSULE | ORAL | 3 refills | Status: DC

## 2022-12-18 NOTE — Assessment & Plan Note (Signed)
Congratulated him on 22 pound weight loss Phentermine refilled today Reinforced low-carb diet and exercise for weight loss

## 2022-12-18 NOTE — Patient Instructions (Signed)
Calorie Counting for Weight Loss Calories are units of energy. Your body needs a certain number of calories from food to keep going throughout the day. When you eat or drink more calories than your body needs, your body stores the extra calories mostly as fat. When you eat or drink fewer calories than your body needs, your body burns fat to get the energy it needs. Calorie counting means keeping track of how many calories you eat and drink each day. Calorie counting can be helpful if you need to lose weight. If you eat fewer calories than your body needs, you should lose weight. Ask your health care provider what a healthy weight is for you. For calorie counting to work, you will need to eat the right number of calories each day to lose a healthy amount of weight per week. A dietitian can help you figure out how many calories you need in a day and will suggest ways to reach your calorie goal. A healthy amount of weight to lose each week is usually 1-2 lb (0.5-0.9 kg). This usually means that your daily calorie intake should be reduced by 500-750 calories. Eating 1,200-1,500 calories a day can help most women lose weight. Eating 1,500-1,800 calories a day can help most men lose weight. What do I need to know about calorie counting? Work with your health care provider or dietitian to determine how many calories you should get each day. To meet your daily calorie goal, you will need to: Find out how many calories are in each food that you would like to eat. Try to do this before you eat. Decide how much of the food you plan to eat. Keep a food log. Do this by writing down what you ate and how many calories it had. To successfully lose weight, it is important to balance calorie counting with a healthy lifestyle that includes regular activity. Where do I find calorie information?  The number of calories in a food can be found on a Nutrition Facts label. If a food does not have a Nutrition Facts label, try  to look up the calories online or ask your dietitian for help. Remember that calories are listed per serving. If you choose to have more than one serving of a food, you will have to multiply the calories per serving by the number of servings you plan to eat. For example, the label on a package of bread might say that a serving size is 1 slice and that there are 90 calories in a serving. If you eat 1 slice, you will have eaten 90 calories. If you eat 2 slices, you will have eaten 180 calories. How do I keep a food log? After each time that you eat, record the following in your food log as soon as possible: What you ate. Be sure to include toppings, sauces, and other extras on the food. How much you ate. This can be measured in cups, ounces, or number of items. How many calories were in each food and drink. The total number of calories in the food you ate. Keep your food log near you, such as in a pocket-sized notebook or on an app or website on your mobile phone. Some programs will calculate calories for you and show you how many calories you have left to meet your daily goal. What are some portion-control tips? Know how many calories are in a serving. This will help you know how many servings you can have of a certain   food. Use a measuring cup to measure serving sizes. You could also try weighing out portions on a kitchen scale. With time, you will be able to estimate serving sizes for some foods. Take time to put servings of different foods on your favorite plates or in your favorite bowls and cups so you know what a serving looks like. Try not to eat straight from a food's packaging, such as from a bag or box. Eating straight from the package makes it hard to see how much you are eating and can lead to overeating. Put the amount you would like to eat in a cup or on a plate to make sure you are eating the right portion. Use smaller plates, glasses, and bowls for smaller portions and to prevent  overeating. Try not to multitask. For example, avoid watching TV or using your computer while eating. If it is time to eat, sit down at a table and enjoy your food. This will help you recognize when you are full. It will also help you be more mindful of what and how much you are eating. What are tips for following this plan? Reading food labels Check the calorie count compared with the serving size. The serving size may be smaller than what you are used to eating. Check the source of the calories. Try to choose foods that are high in protein, fiber, and vitamins, and low in saturated fat, trans fat, and sodium. Shopping Read nutrition labels while you shop. This will help you make healthy decisions about which foods to buy. Pay attention to nutrition labels for low-fat or fat-free foods. These foods sometimes have the same number of calories or more calories than the full-fat versions. They also often have added sugar, starch, or salt to make up for flavor that was removed with the fat. Make a grocery list of lower-calorie foods and stick to it. Cooking Try to cook your favorite foods in a healthier way. For example, try baking instead of frying. Use low-fat dairy products. Meal planning Use more fruits and vegetables. One-half of your plate should be fruits and vegetables. Include lean proteins, such as chicken, turkey, and fish. Lifestyle Each week, aim to do one of the following: 150 minutes of moderate exercise, such as walking. 75 minutes of vigorous exercise, such as running. General information Know how many calories are in the foods you eat most often. This will help you calculate calorie counts faster. Find a way of tracking calories that works for you. Get creative. Try different apps or programs if writing down calories does not work for you. What foods should I eat?  Eat nutritious foods. It is better to have a nutritious, high-calorie food, such as an avocado, than a food with  few nutrients, such as a bag of potato chips. Use your calories on foods and drinks that will fill you up and will not leave you hungry soon after eating. Examples of foods that fill you up are nuts and nut butters, vegetables, lean proteins, and high-fiber foods such as whole grains. High-fiber foods are foods with more than 5 g of fiber per serving. Pay attention to calories in drinks. Low-calorie drinks include water and unsweetened drinks. The items listed above may not be a complete list of foods and beverages you can eat. Contact a dietitian for more information. What foods should I limit? Limit foods or drinks that are not good sources of vitamins, minerals, or protein or that are high in unhealthy fats. These   include: Candy. Other sweets. Sodas, specialty coffee drinks, alcohol, and juice. The items listed above may not be a complete list of foods and beverages you should avoid. Contact a dietitian for more information. How do I count calories when eating out? Pay attention to portions. Often, portions are much larger when eating out. Try these tips to keep portions smaller: Consider sharing a meal instead of getting your own. If you get your own meal, eat only half of it. Before you start eating, ask for a container and put half of your meal into it. When available, consider ordering smaller portions from the menu instead of full portions. Pay attention to your food and drink choices. Knowing the way food is cooked and what is included with the meal can help you eat fewer calories. If calories are listed on the menu, choose the lower-calorie options. Choose dishes that include vegetables, fruits, whole grains, low-fat dairy products, and lean proteins. Choose items that are boiled, broiled, grilled, or steamed. Avoid items that are buttered, battered, fried, or served with cream sauce. Items labeled as crispy are usually fried, unless stated otherwise. Choose water, low-fat milk,  unsweetened iced tea, or other drinks without added sugar. If you want an alcoholic beverage, choose a lower-calorie option, such as a glass of wine or light beer. Ask for dressings, sauces, and syrups on the side. These are usually high in calories, so you should limit the amount you eat. If you want a salad, choose a garden salad and ask for grilled meats. Avoid extra toppings such as bacon, cheese, or fried items. Ask for the dressing on the side, or ask for olive oil and vinegar or lemon to use as dressing. Estimate how many servings of a food you are given. Knowing serving sizes will help you be aware of how much food you are eating at restaurants. Where to find more information Centers for Disease Control and Prevention: www.cdc.gov [place] Department of Agriculture: myplate.gov Summary Calorie counting means keeping track of how many calories you eat and drink each day. If you eat fewer calories than your body needs, you should lose weight. A healthy amount of weight to lose per week is usually 1-2 lb (0.5-0.9 kg). This usually means reducing your daily calorie intake by 500-750 calories. The number of calories in a food can be found on a Nutrition Facts label. If a food does not have a Nutrition Facts label, try to look up the calories online or ask your dietitian for help. Use smaller plates, glasses, and bowls for smaller portions and to prevent overeating. Use your calories on foods and drinks that will fill you up and not leave you hungry shortly after a meal. This information is not intended to replace advice given to you by your health care provider. Make sure you discuss any questions you have with your health care provider. Document Revised: 08/17/2019 Document Reviewed: 08/17/2019 Elsevier Patient Education  2023 Elsevier Inc.  

## 2022-12-18 NOTE — Progress Notes (Signed)
Subjective:    Patient ID: Blake Perez, male    DOB: 06/28/1991, 32 y.o.   MRN: [id number]  HPI  Patient presents to clinic today for 1 month follow-up for weight gain and med refill.  He was started on phentermine 4/22.  He has been taking the medication as prescribed.  His starting weight was 323 pounds with a BMI of 42.62.  His weight today is 301 pounds with a BMI of 39.71.  He reports he did run out of the medication 2 weeks ago and did feel slight depression not being on the medication.  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 For 10 days 20 capsule 0   ibuprofen (ADVIL) 600 MG tablet Take 1 tablet (600 mg total) by mouth every 8 (eight) hours as needed for moderate pain or mild pain. 30 tablet 0   phentermine 15 MG capsule Take 1 capsule (15 mg total) by mouth every morning. 30 capsule 0   No current facility-administered medications for this visit.    No Known Allergies  Family History  Problem Relation Age of Onset   Healthy Mother    Cerebral aneurysm Father    Alcohol abuse Father    Scoliosis Sister    Healthy Brother    Healthy Brother    Healthy Brother    Diabetes Maternal Grandfather    Osteoarthritis Paternal Grandmother        Back issues    Social History   Socioeconomic History   Marital status: Married    Spouse name: Not on file   Number of children: 0   Years of education: Not on file   Highest education level: Associate degree: occupational, Scientist, product/process development, or vocational program  Occupational History   Not on file  Tobacco Use   Smoking status: Former    Types: Cigarettes    Quit date: 2015    Years since quitting: 9.4   Smokeless  tobacco: Former    Types: Snuff  Vaping Use   Vaping Use: Former  Substance and Sexual Activity   Alcohol use: Yes    Comment: occassional   Drug use: Never   Sexual activity: Yes  Other Topics Concern   Not on file  Social History Narrative   Lives with girlfriend   Social Determinants of Health   Financial Resource Strain: Low Risk  (12/18/2022)   Overall Financial Resource Strain (CARDIA)    Difficulty of Paying Living Expenses: Not hard at all  Food Insecurity: No Food Insecurity (12/18/2022)   Hunger Vital Sign    Worried About Running Out of Food in the Last Year: Never true    Ran Out of Food in the Last Year: Never true  Transportation Needs: No Transportation Needs (12/18/2022)   PRAPARE - Administrator, Civil Service (Medical): No    Lack of Transportation (Non-Medical): No  Physical Activity: Insufficiently Active (12/18/2022)   Exercise Vital Sign    Days of Exercise per Week: 3 days    Minutes of Exercise per Session: 30 min  Stress: No Stress Concern Present (12/18/2022)   [employer] of Occupational Health - Occupational  Stress Questionnaire    Feeling of Stress : Only a little  Social Connections: Moderately Integrated (12/18/2022)   Social Connection and Isolation Panel [NHANES]    Frequency of Communication with Friends and Family: More than three times a week    Frequency of Social Gatherings with Friends and Family: More than three times a week    Attends Religious Services: 1 to 4 times per year    Active Member of [employer] or Organizations: No    Attends Engineer, structural: Not on file    Marital Status: Married  Catering manager Violence: Not on file     Constitutional: Denies fever, malaise, fatigue, headache or abrupt weight changes.  Respiratory: Denies difficulty breathing, shortness of breath, cough or sputum production.   Cardiovascular: Denies chest pain, chest tightness, palpitations or swelling in the hands or feet.   Musculoskeletal: Patient reports chronic back pain.  Denies decrease in range of motion, difficulty with gait, muscle pain or joint swelling.  Skin: Denies redness, rashes, lesions or ulcercations.  Neurological: Denies dizziness, difficulty with memory, difficulty with speech or problems with balance and coordination.  Psych: Denies anxiety, depression, SI/HI.  No other specific complaints in a complete review of systems (except as listed in HPI above).  Objective:   Physical Exam   BP 114/72 (BP Location: Left Arm, Patient Position: Sitting, Cuff Size: Large)   Pulse 80   Temp (!) 96.6 F (35.9 C) (Temporal)   Ht 6\' 1"  (1.854 m)   Wt (!) 301 lb (136.5 kg)   SpO2 99%   BMI 39.71 kg/m   Wt Readings from Last 3 Encounters:  11/24/22 (!) 308 lb (139.7 kg)  11/03/22 (!) 323 lb (146.5 kg)  11/10/21 282 lb (127.9 kg)    General: Appears his stated age, obese in NAD. Cardiovascular: Normal rate and rhythm.  Pulmonary/Chest: Normal effort and positive vesicular breath sounds. No respiratory distress. No wheezes, rales or ronchi noted.  Neurological: Alert and oriented.   BMET    Component Value Date/Time   NA 138 11/03/2022 1047   K 4.6 11/03/2022 1047   CL 104 11/03/2022 1047   CO2 27 11/03/2022 1047   GLUCOSE 94 11/03/2022 1047   BUN 16 11/03/2022 1047   CREATININE 1.04 11/03/2022 1047   CALCIUM 9.2 11/03/2022 1047   GFRNONAA >60 10/31/2021 1228    Lipid Panel     Component Value Date/Time   CHOL 138 11/03/2022 1047   TRIG 75 11/03/2022 1047   HDL 65 11/03/2022 1047   CHOLHDL 2.1 11/03/2022 1047   LDLCALC 57 11/03/2022 1047    CBC    Component Value Date/Time   WBC 4.8 11/03/2022 1047   RBC 5.38 11/03/2022 1047   HGB 16.0 11/03/2022 1047   HCT 47.5 11/03/2022 1047   PLT 249 11/03/2022 1047   MCV 88.3 11/03/2022 1047   MCH 29.7 11/03/2022 1047   MCHC 33.7 11/03/2022 1047   RDW 12.5 11/03/2022 1047    Hgb A1C Lab Results  Component Value Date    HGBA1C 6.0 (H) 11/03/2022           Assessment & Plan:      RTC in 4 months for follow-up of chronic conditions Nicki Reaper, NP

## 2023-01-06 IMAGING — MR MR LUMBAR SPINE W/O CM
5 series · 31 of 48 positions shown · non-contrast
Comparison: 07/21/2021

CLINICAL DATA: Low back pain radiating down the right leg

EXAM:
MRI LUMBAR SPINE WITHOUT CONTRAST
TECHNIQUE: Multiplanar, multisequence MR imaging of the lumbar spine was
performed. No intravenous contrast was administered.

[Series 5: T2 · sagittal · 4.0mm · 0.81mm/px · 6 of 15 slices shown (1 of 2)]
[im 1/15]
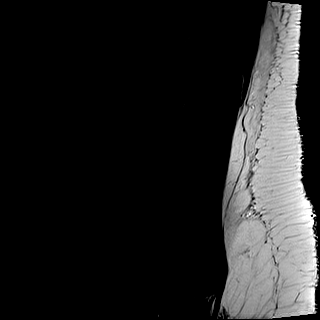
[im 3/15]
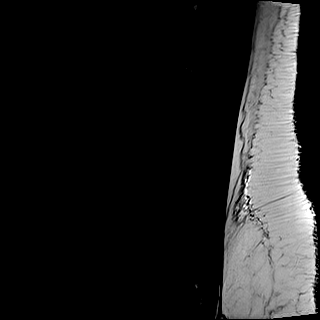
[im 6/15]
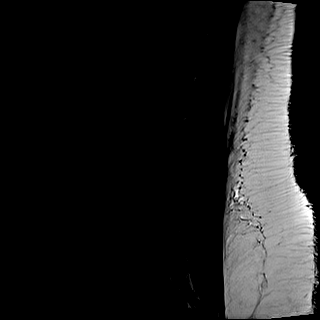
[im 9/15]
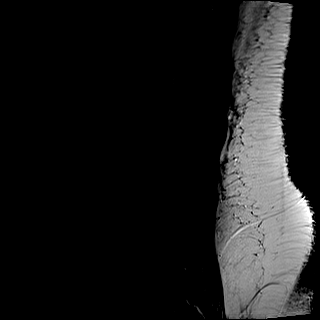
[im 12/15]
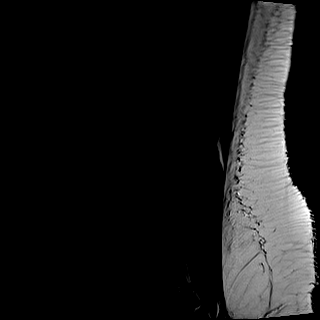
[im 15/15]
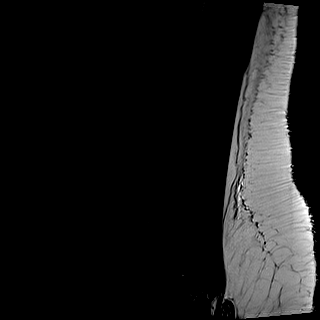

[Series 6: T1 · sagittal · 4.0mm · 0.81mm/px · 7 of 15 slices shown (1 of 2)]
[im 1/15]
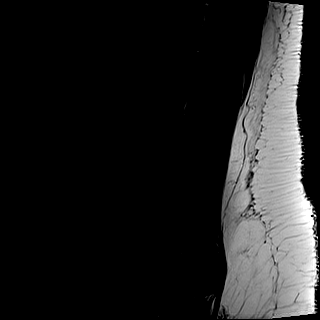
[im 3/15]
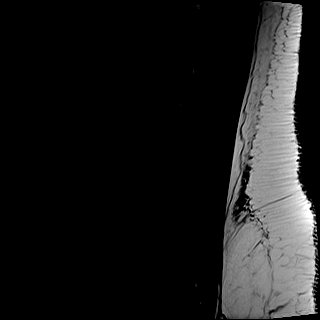
[im 5/15]
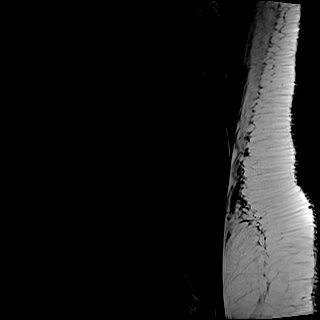
[im 8/15]
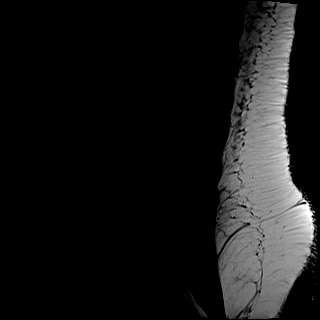
[im 10/15]
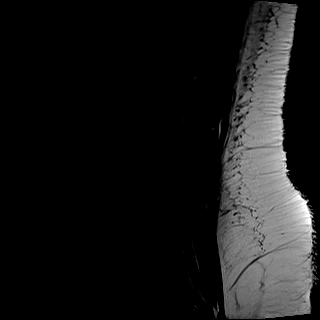
[im 12/15]
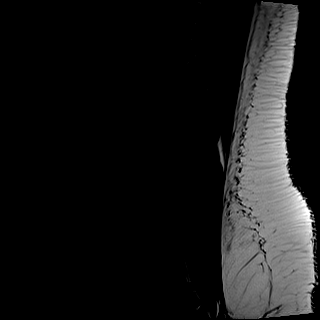
[im 15/15]
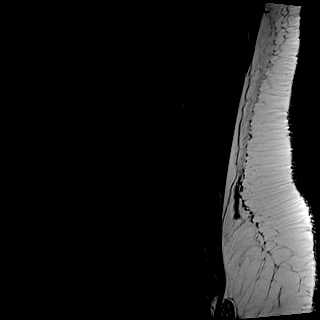

[Series 7: STIR · sagittal · 4.0mm · 0.41mm/px · 2 of 15 slices shown]
[im 1/15]
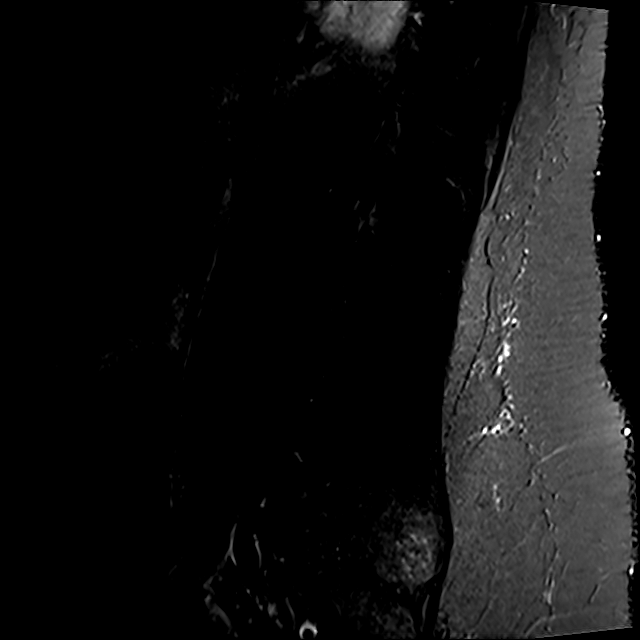
[im 3/15]
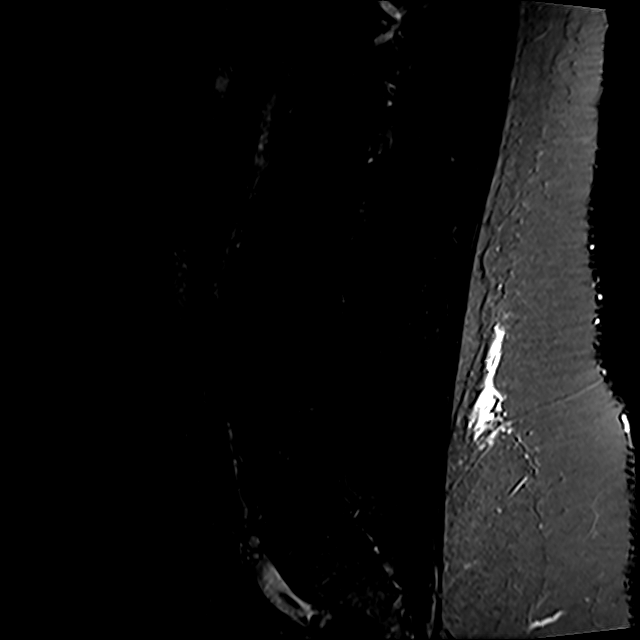

[Series 8: T2 · axial · 4.0mm · 0.78mm/px · z∈[-78,+111]mm · 8 of 31 slices shown (2 of 2)]
[im 1/31]
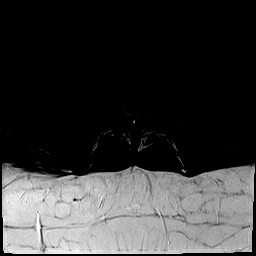
[im 5/31]
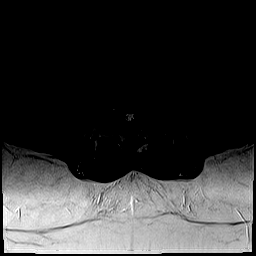
[im 10/31]
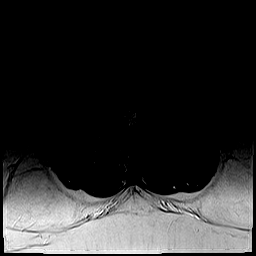
[im 14/31]
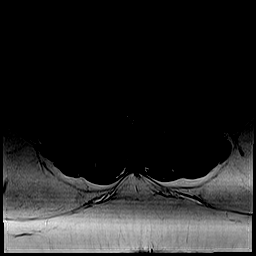
[im 17/31]
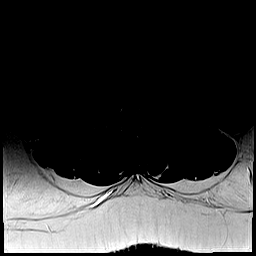
[im 21/31]
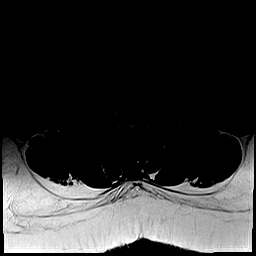
[im 26/31]
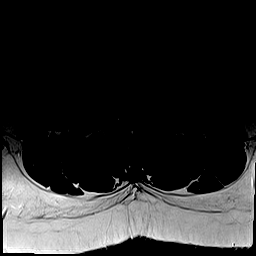
[im 31/31]
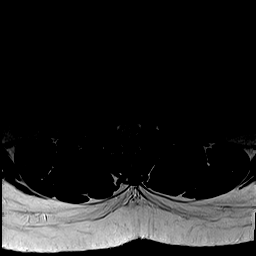

[Series 9: T1 · axial · 4.0mm · 0.39mm/px · z∈[-78,+111]mm · 8 of 31 slices shown (2 of 2)]
[im 1/31]
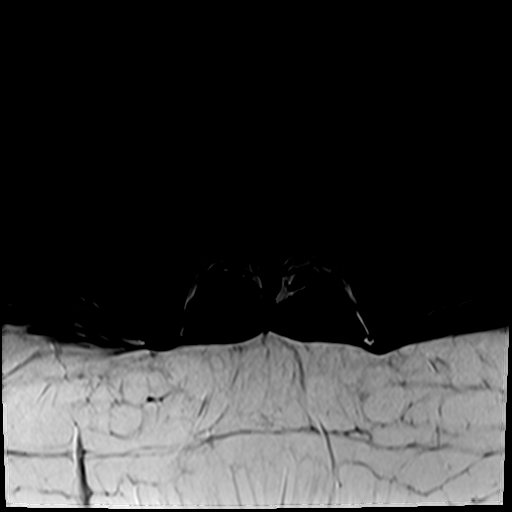
[im 5/31]
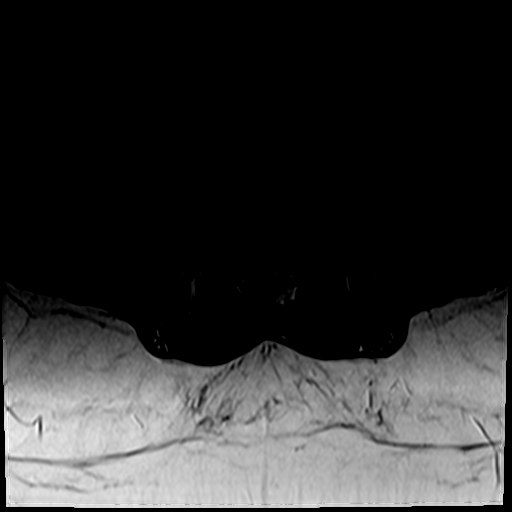
[im 10/31]
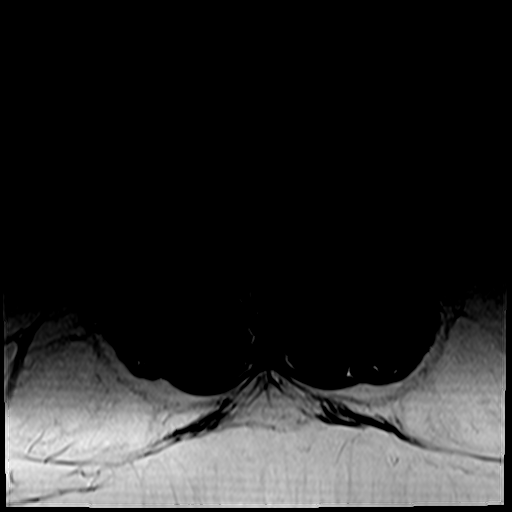
[im 14/31]
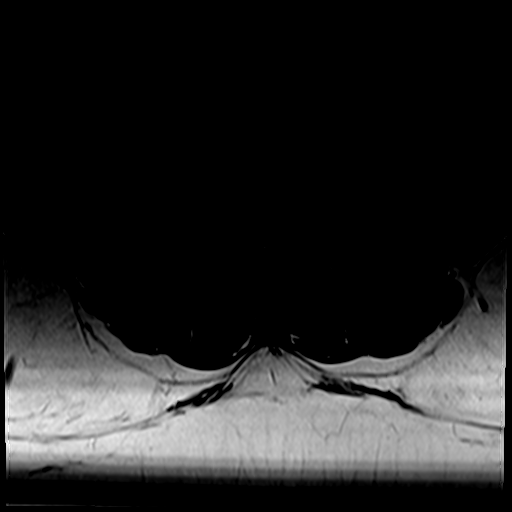
[im 17/31]
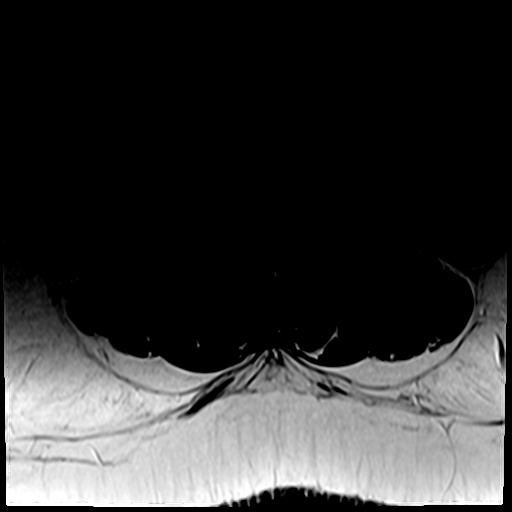
[im 21/31]
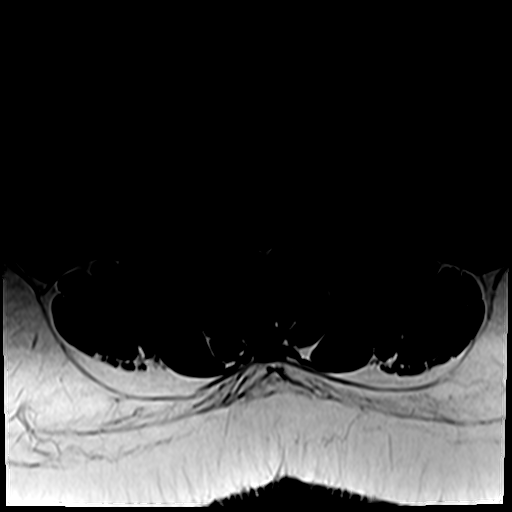
[im 26/31]
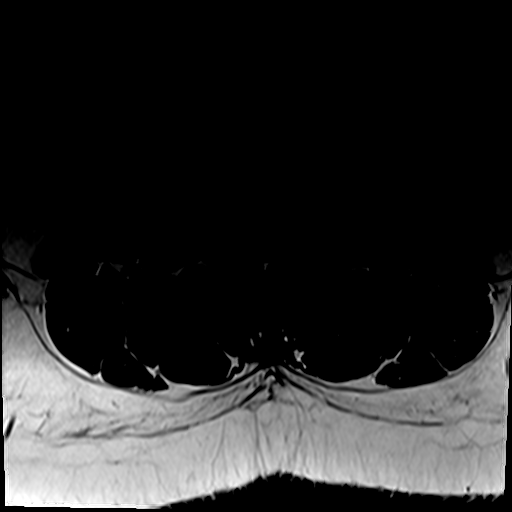
[im 31/31]
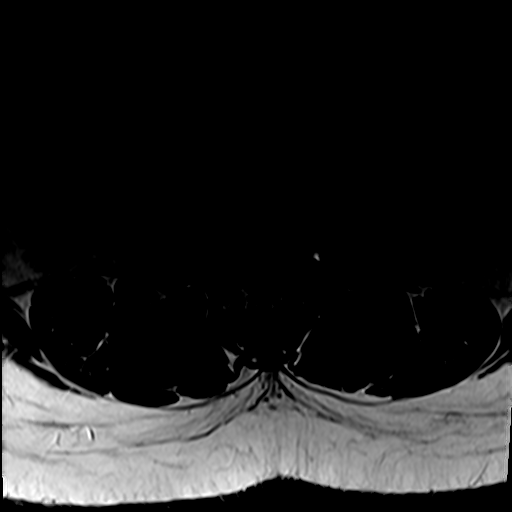

[31 of 48 positions shown; findings below may reference images not displayed]

FINDINGS: Segmentation:  Standard.

Alignment:  Minimal retrolisthesis of L5 on S1.

Vertebrae: No acute fracture, evidence of discitis, or aggressive
bone lesion.

Conus medullaris and cauda equina: Conus extends to the L1 level.
Conus and cauda equina appear normal.

Paraspinal and other soft tissues: No acute paraspinal abnormality.

Disc levels:

Disc spaces: Degenerative disease with mild disc height loss at
L5-S1.

T12-L1: No significant disc bulge. No neural foraminal stenosis. No
central canal stenosis.

L1-L2: Minimal broad-based disc bulge. No foraminal or central canal
stenosis.

L2-L3: Minimal broad-based disc bulge. No foraminal or central canal
stenosis.

L3-L4: No significant disc bulge. No neural foraminal stenosis. No
central canal stenosis.

L4-L5: No significant disc bulge. No neural foraminal stenosis. No
central canal stenosis. Mild bilateral facet arthropathy.

L5-S1: Broad-based disc bulge with a right paracentral disc
protrusion with mass effect on the right intraspinal S1 nerve root.
Mild bilateral facet arthropathy. No foraminal or central canal
stenosis.
IMPRESSION: 1. At L5-S1 there is a broad-based disc bulge with a right
paracentral disc protrusion with mass effect on the right
intraspinal S1 nerve root. Mild bilateral facet arthropathy.

## 2023-01-16 ENCOUNTER — Encounter: Payer: Self-pay | Admitting: Internal Medicine

## 2023-01-18 MED ORDER — PHENTERMINE HCL 30 MG PO CAPS: 30.0000 mg | ORAL_CAPSULE | ORAL | 0 refills | Status: DC

## 2023-04-22 ENCOUNTER — Other Ambulatory Visit: Payer: Self-pay | Admitting: Internal Medicine

## 2023-04-23 MED ORDER — PHENTERMINE HCL 30 MG PO CAPS: 30.0000 mg | ORAL_CAPSULE | ORAL | 0 refills | Status: DC

## 2023-04-26 ENCOUNTER — Encounter: Payer: Self-pay | Admitting: Internal Medicine

## 2023-04-26 ENCOUNTER — Ambulatory Visit: Payer: BC Managed Care – PPO | Admitting: Internal Medicine

## 2023-04-26 VITALS — BP 106/68 | HR 86 | Temp 96.8°F | Wt 295.0 lb

## 2023-04-26 DIAGNOSIS — R7303 Prediabetes: Secondary | ICD-10-CM | POA: Diagnosis not present

## 2023-04-26 DIAGNOSIS — G8929 Other chronic pain: Secondary | ICD-10-CM

## 2023-04-26 DIAGNOSIS — Z6838 Body mass index (BMI) 38.0-38.9, adult: Secondary | ICD-10-CM

## 2023-04-26 DIAGNOSIS — M5440 Lumbago with sciatica, unspecified side: Secondary | ICD-10-CM | POA: Diagnosis not present

## 2023-04-26 DIAGNOSIS — E6609 Other obesity due to excess calories: Secondary | ICD-10-CM

## 2023-04-26 DIAGNOSIS — E66812 Obesity, class 2: Secondary | ICD-10-CM | POA: Diagnosis not present

## 2023-04-26 NOTE — Assessment & Plan Note (Signed)
Encouraged regular stretching and core strengthening Can take Tylenol or ibuprofen OTC as needed

## 2023-04-26 NOTE — Patient Instructions (Signed)
Prediabetes Eating Plan Prediabetes is a condition that causes blood sugar (glucose) levels to be higher than normal. This increases the risk for developing type 2 diabetes (type 2 diabetes mellitus). Working with a health care provider or nutrition specialist (dietitian) to make diet and lifestyle changes can help prevent the onset of diabetes. These changes may help you: Control your blood glucose levels. Improve your cholesterol levels. Manage your blood pressure. What are tips for following this plan? Reading food labels Read food labels to check the amount of fat, salt (sodium), and sugar in prepackaged foods. Avoid foods that have: Saturated fats. Trans fats. Added sugars. Avoid foods that have more than 300 milligrams (mg) of sodium per serving. Limit your sodium intake to less than 2,300 mg each day. Shopping Avoid buying pre-made and processed foods. Avoid buying drinks with added sugar. Cooking Cook with olive oil. Do not use butter, lard, or ghee. Bake, broil, grill, steam, or boil foods. Avoid frying. Meal planning  Work with your dietitian to create an eating plan that is right for you. This may include tracking how many calories you take in each day. Use a food diary, notebook, or mobile application to track what you eat at each meal. Consider following a Mediterranean diet. This includes: Eating several servings of fresh fruits and vegetables each day. Eating fish at least twice a week. Eating one serving each day of whole grains, beans, nuts, and seeds. Using olive oil instead of other fats. Limiting alcohol. Limiting red meat. Using nonfat or low-fat dairy products. Consider following a plant-based diet. This includes dietary choices that focus on eating mostly vegetables and fruit, grains, beans, nuts, and seeds. If you have high blood pressure, you may need to limit your sodium intake or follow a diet such as the DASH (Dietary Approaches to Stop Hypertension) eating  plan. The DASH diet aims to lower high blood pressure. Lifestyle Set weight loss goals with help from your health care team. It is recommended that most people with prediabetes lose 7% of their body weight. Exercise for at least 30 minutes 5 or more days a week. Attend a support group or seek support from a mental health counselor. Take over-the-counter and prescription medicines only as told by your health care provider. What foods are recommended? Fruits Berries. Bananas. Apples. Oranges. Grapes. Papaya. Mango. Pomegranate. Kiwi. Grapefruit. Cherries. Vegetables Lettuce. Spinach. Peas. Beets. Cauliflower. Cabbage. Broccoli. Carrots. Tomatoes. Squash. Eggplant. Herbs. Peppers. Onions. Cucumbers. Brussels sprouts. Grains Whole grains, such as whole-wheat or whole-grain breads, crackers, cereals, and pasta. Unsweetened oatmeal. Bulgur. Barley. Quinoa. Brown rice. Corn or whole-wheat flour tortillas or taco shells. Meats and other proteins Seafood. Poultry without skin. Lean cuts of pork and beef. Tofu. Eggs. Nuts. Beans. Dairy Low-fat or fat-free dairy products, such as yogurt, cottage cheese, and cheese. Beverages Water. Tea. Coffee. Sugar-free or diet soda. Seltzer water. Low-fat or nonfat milk. Milk alternatives, such as soy or almond milk. Fats and oils Olive oil. Canola oil. Sunflower oil. Grapeseed oil. Avocado. Walnuts. Sweets and desserts Sugar-free or low-fat pudding. Sugar-free or low-fat ice cream and other frozen treats. Seasonings and condiments Herbs. Sodium-free spices. Mustard. Relish. Low-salt, low-sugar ketchup. Low-salt, low-sugar barbecue sauce. Low-fat or fat-free mayonnaise. The items listed above may not be a complete list of recommended foods and beverages. Contact a dietitian for more information. What foods are not recommended? Fruits Fruits canned with syrup. Vegetables Canned vegetables. Frozen vegetables with butter or cream sauce. Grains Refined white  flour and flour   products, such as bread, pasta, snack foods, and cereals. Meats and other proteins Fatty cuts of meat. Poultry with skin. Breaded or fried meat. Processed meats. Dairy Full-fat yogurt, cheese, or milk. Beverages Sweetened drinks, such as iced tea and soda. Fats and oils Butter. Lard. Ghee. Sweets and desserts Baked goods, such as cake, cupcakes, pastries, cookies, and cheesecake. Seasonings and condiments Spice mixes with added salt. Ketchup. Barbecue sauce. Mayonnaise. The items listed above may not be a complete list of foods and beverages that are not recommended. Contact a dietitian for more information. Where to find more information American Diabetes Association: www.diabetes.org Summary You may need to make diet and lifestyle changes to help prevent the onset of diabetes. These changes can help you control blood sugar, improve cholesterol levels, and manage blood pressure. Set weight loss goals with help from your health care team. It is recommended that most people with prediabetes lose 7% of their body weight. Consider following a Mediterranean diet. This includes eating plenty of fresh fruits and vegetables, whole grains, beans, nuts, seeds, fish, and low-fat dairy, and using olive oil instead of other fats. This information is not intended to replace advice given to you by your health care provider. Make sure you discuss any questions you have with your health care provider. Document Revised: 10/05/2019 Document Reviewed: 10/05/2019 Elsevier Patient Education  2024 Elsevier Inc.  

## 2023-04-26 NOTE — Assessment & Plan Note (Signed)
A1c today Encourage low-carb diet and exercise for weight

## 2023-04-26 NOTE — Assessment & Plan Note (Signed)
Encourage diet and exercise for weight loss Continue phentermine, advised him we can go up to 30 or 37.5 whenever he feels the need

## 2023-04-26 NOTE — Progress Notes (Signed)
Subjective:    Patient ID: Blake Perez, male    DOB: 11-24-1990, 32 y.o.   MRN: [id number]  HPI  Patient presents to clinic today for 33-month follow-up of chronic conditions.  Chronic back pain: X-ray and MRI lumbar spine 07/2021 reviewed. S/p surgical intervention 10/2021. He does not take any medications for this. He no longer follows with neurosurgery.  Prediabetes: His last A1c was 6%, 10/2022.  He is not taking any oral diabetic medication at this time.  He does not check his sugars.  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 30 capsule 0   No current facility-administered medications for this visit.    No Known Allergies  Family History  Problem Relation Age of Onset   Healthy Mother    Cerebral aneurysm Father    Alcohol abuse Father    Scoliosis Sister    Healthy Brother    Healthy Brother    Healthy Brother    Diabetes Maternal Grandfather    Osteoarthritis Paternal Grandmother        Back issues    Social History   Socioeconomic History   Marital status: Married    Spouse name: Not on file   Number of children: 0   Years of education: Not on file   Highest education level: Associate degree: occupational, Scientist, product/process development, or vocational program  Occupational History   Not on file  Tobacco Use   Smoking status: Former    Current packs/day: 0.00    Types: Cigarettes    Quit date: 2015    Years since quitting: 9.7   Smokeless tobacco: Former    Types: Snuff  Vaping Use   Vaping status: Former  Substance and Sexual Activity   Alcohol use: Yes    Comment: occassional   Drug use: Never   Sexual activity: Yes  Other Topics Concern   Not on file  Social History  Narrative   Lives with girlfriend   Social Determinants of Health   Financial Resource Strain: Low Risk  (12/18/2022)   Overall Financial Resource Strain (CARDIA)    Difficulty of Paying Living Expenses: Not hard at all  Food Insecurity: No Food Insecurity (12/18/2022)   Hunger Vital Sign    Worried About Running Out of Food in the Last Year: Never true    Ran Out of Food in the Last Year: Never true  Transportation Needs: No Transportation Needs (12/18/2022)   PRAPARE - Administrator, Civil Service (Medical): No    Lack of Transportation (Non-Medical): No  Physical Activity: Insufficiently Active (12/18/2022)   Exercise Vital Sign    Days of Exercise per Week: 3 days    Minutes of Exercise per Session: 30 min  Stress: No Stress Concern Present (12/18/2022)   [employer] of Occupational Health - Occupational Stress Questionnaire    Feeling of Stress : Only a little  Social Connections: Moderately Integrated (12/18/2022)   Social Connection and Isolation Panel [NHANES]    Frequency of Communication with Friends and Family: More than three times a week    Frequency of Social Gatherings with Friends and Family: More than three times a week  Attends Religious Services: 1 to 4 times per year    Active Member of Clubs or Organizations: No    Attends Engineer, structural: Not on file    Marital Status: Married  Catering manager Violence: Not on file     Constitutional: Denies fever, malaise, fatigue, headache or abrupt weight changes.  HEENT: Denies eye pain, eye redness, ear pain, ringing in the ears, wax buildup, runny nose, nasal congestion, bloody nose, or sore throat. Respiratory: Denies difficulty breathing, shortness of breath, cough or sputum production.   Cardiovascular: Denies chest pain, chest tightness, palpitations or swelling in the hands or feet.  Gastrointestinal: Denies abdominal pain, bloating, constipation, diarrhea or blood in the stool.   GU: Pt reports frequency. Denies urgency, pain with urination, burning sensation, blood in urine, odor or discharge. Musculoskeletal: Patient reports chronic low back pain.  Denies decrease in range of motion, difficulty with gait, or joint swelling.  Skin: Denies redness, rashes, lesions or ulcercations.  Neurological: Denies dizziness, difficulty with memory, difficulty with speech or problems with balance and coordination.  Psych: Denies anxiety, depression, SI/HI.  No other specific complaints in a complete review of systems (except as listed in HPI above).  Objective:   Physical Exam   BP 106/68 (BP Location: Left Arm, Patient Position: Sitting, Cuff Size: Large)   Pulse 86   Temp (!) 96.8 F (36 C) (Temporal)   Wt 295 lb (133.8 kg)   SpO2 98%   BMI 38.92 kg/m   Wt Readings from Last 3 Encounters:  12/18/22 (!) 301 lb (136.5 kg)  11/24/22 (!) 308 lb (139.7 kg)  11/03/22 (!) 323 lb (146.5 kg)    General: Appears his stated age, obese, in NAD. Skin: Warm, dry and intact.  Cardiovascular: Normal rate and rhythm. S1,S2 noted.  No murmur, rubs or gallops noted.  Pulmonary/Chest: Normal effort and positive vesicular breath sounds. No respiratory distress. No wheezes, rales or ronchi noted.  Musculoskeletal:  No difficulty with gait.  Neurological: Alert and oriented.     BMET    Component Value Date/Time   NA 138 11/03/2022 1047   K 4.6 11/03/2022 1047   CL 104 11/03/2022 1047   CO2 27 11/03/2022 1047   GLUCOSE 94 11/03/2022 1047   BUN 16 11/03/2022 1047   CREATININE 1.04 11/03/2022 1047   CALCIUM 9.2 11/03/2022 1047   GFRNONAA >60 10/31/2021 1228    Lipid Panel     Component Value Date/Time   CHOL 138 11/03/2022 1047   TRIG 75 11/03/2022 1047   HDL 65 11/03/2022 1047   CHOLHDL 2.1 11/03/2022 1047   LDLCALC 57 11/03/2022 1047    CBC    Component Value Date/Time   WBC 4.8 11/03/2022 1047   RBC 5.38 11/03/2022 1047   HGB 16.0 11/03/2022 1047   HCT 47.5  11/03/2022 1047   PLT 249 11/03/2022 1047   MCV 88.3 11/03/2022 1047   MCH 29.7 11/03/2022 1047   MCHC 33.7 11/03/2022 1047   RDW 12.5 11/03/2022 1047    Hgb A1C Lab Results  Component Value Date   HGBA1C 6.0 (H) 11/03/2022           Assessment & Plan:     RTC in 6 months for your annual exam Nicki Reaper, NP

## 2023-04-27 LAB — HEMOGLOBIN A1C
Hgb A1c MFr Bld: 6 %{Hb} — ABNORMAL HIGH (ref <5.7)
Mean Plasma Glucose: 126 mg/dL
eAG (mmol/L): 7 mmol/L

## 2023-06-24 ENCOUNTER — Other Ambulatory Visit: Payer: Self-pay | Admitting: Internal Medicine

## 2023-06-25 MED ORDER — PHENTERMINE HCL 30 MG PO CAPS: 30.0000 mg | ORAL_CAPSULE | ORAL | 0 refills | Status: DC

## 2023-11-04 ENCOUNTER — Encounter: Payer: BC Managed Care – PPO | Admitting: Internal Medicine

## 2023-11-09 ENCOUNTER — Ambulatory Visit (INDEPENDENT_AMBULATORY_CARE_PROVIDER_SITE_OTHER): Admitting: Internal Medicine

## 2023-11-09 ENCOUNTER — Encounter: Payer: Self-pay | Admitting: Internal Medicine

## 2023-11-09 VITALS — BP 112/72 | Ht 73.0 in | Wt 293.4 lb

## 2023-11-09 DIAGNOSIS — Z0001 Encounter for general adult medical examination with abnormal findings: Secondary | ICD-10-CM

## 2023-11-09 DIAGNOSIS — R739 Hyperglycemia, unspecified: Secondary | ICD-10-CM | POA: Diagnosis not present

## 2023-11-09 DIAGNOSIS — E66812 Obesity, class 2: Secondary | ICD-10-CM | POA: Diagnosis not present

## 2023-11-09 DIAGNOSIS — Z6838 Body mass index (BMI) 38.0-38.9, adult: Secondary | ICD-10-CM

## 2023-11-09 DIAGNOSIS — R7303 Prediabetes: Secondary | ICD-10-CM | POA: Diagnosis not present

## 2023-11-09 NOTE — Progress Notes (Signed)
 HPI  Patient presents to clinic today for his annual exam.  Flu: never Tetanus: 10/2022 Covid: never Dentist: as needed  Diet: He does eat meat. He consumes fruits and veggies. He tries to avoid fried foods. He drinks mostly Zero Sugar Soda. 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 30 capsule 0   No current facility-administered medications for this visit.    No Known Allergies  Family History  Problem Relation Age of Onset   Healthy Mother    Cerebral aneurysm Father    Alcohol abuse Father    Scoliosis Sister    Healthy Brother    Healthy Brother    Healthy Brother    Diabetes Maternal Grandfather    Osteoarthritis Paternal Grandmother        Back issues    Social History   Socioeconomic History   Marital status: Married    Spouse name: Not on file   Number of children: 0   Years of education: Not on file   Highest education level: Associate degree: occupational, Scientist, product/process development, or vocational program  Occupational History   Not on file  Tobacco Use   Smoking status: Former    Current packs/day: 0.00    Types: Cigarettes    Quit date: 2015    Years since quitting: 10.3   Smokeless tobacco: Former    Types: Snuff  Vaping Use   Vaping status: Former  Substance and Sexual Activity   Alcohol use: Yes    Comment: occassional   Drug use: Never   Sexual activity: Yes  Other Topics Concern   Not on file  Social History Narrative   Lives with girlfriend   Social Drivers of Health   Financial Resource Strain: Low Risk  (12/18/2022)   Overall Financial Resource Strain (CARDIA)    Difficulty of Paying Living Expenses: Not hard at all  Food Insecurity: No Food Insecurity  (12/18/2022)   Hunger Vital Sign    Worried About Running Out of Food in the Last Year: Never true    Ran Out of Food in the Last Year: Never true  Transportation Needs: No Transportation Needs (12/18/2022)   PRAPARE - Administrator, Civil Service (Medical): No    Lack of Transportation (Non-Medical): No  Physical Activity: Insufficiently Active (12/18/2022)   Exercise Vital Sign    Days of Exercise per Week: 3 days    Minutes of Exercise per Session: 30 min  Stress: No Stress Concern Present (12/18/2022)   [employer] of Occupational Health - Occupational Stress Questionnaire    Feeling of Stress : Only a little  Social Connections: Moderately Integrated (12/18/2022)   Social Connection and Isolation Panel [NHANES]    Frequency of Communication with Friends and Family: More than three times a week    Frequency of Social Gatherings with Friends and Family: More than three times a week    Attends Religious Services: 1 to 4 times per year    Active Member of [employer] or Organizations: No    Attends Engineer, structural: Not on file    Marital Status: Married  Catering manager Violence: Not on file    ROS:  Constitutional: Denies fever,  malaise, fatigue, headache or abrupt weight changes.  HEENT: Denies eye pain, eye redness, ear pain, ringing in the ears, wax buildup, runny nose, nasal congestion, bloody nose, or sore throat. Respiratory: Denies difficulty breathing, shortness of breath, cough or sputum production.   Cardiovascular: Denies chest pain, chest tightness, palpitations or swelling in the hands or feet.  Gastrointestinal: Denies abdominal pain, bloating, constipation, diarrhea or blood in the stool.  GU: Denies frequency, urgency, pain with urination, blood in urine, odor or discharge. Musculoskeletal: Pt reports intermittent back pain. Denies decrease in range of motion, difficulty with gait, or joint swelling.  Skin: Denies redness, rashes, lesions or  ulcercations.  Neurological: Denies dizziness, difficulty with memory, difficulty with speech or problems with balance and coordination.  Psych: Denies anxiety, depression, SI/HI.  No other specific complaints in a complete review of systems (except as listed in HPI above).  PE:  BP 112/72 (BP Location: Left Arm, Patient Position: Sitting, Cuff Size: Large)   Ht 6\' 1"  (1.854 m)   Wt 293 lb 6.4 oz (133.1 kg)   BMI 38.71 kg/m   Wt Readings from Last 3 Encounters:  04/26/23 295 lb (133.8 kg)  12/18/22 (!) 301 lb (136.5 kg)  11/24/22 (!) 308 lb (139.7 kg)    General: Appears his stated age, obese, in NAD. HEENT: Head: normal shape and size; Eyes: sclera white, no icterus, conjunctiva pink, PERRLA and EOMs intact;  Neck: Neck supple, trachea midline. No masses, lumps or thyromegaly present.  Cardiovascular: Normal rate and rhythm. S1,S2 noted.  No murmur, rubs or gallops noted. No JVD or BLE edema.  Varicose veins noted of BLE, R >L. Pulmonary/Chest: Normal effort and positive vesicular breath sounds. No respiratory distress. No wheezes, rales or ronchi noted.  Abdomen: Normal bowel sounds. Musculoskeletal: Strength 5/5 BUE/BLE. No difficulty with gait.  Neurological: Alert and oriented. Cranial nerves II-XII grossly intact. Coordination normal.  Psychiatric: Mood and affect normal. Behavior is normal. Judgment and thought content normal.     BMET    Component Value Date/Time   NA 138 11/03/2022 1047   K 4.6 11/03/2022 1047   CL 104 11/03/2022 1047   CO2 27 11/03/2022 1047   GLUCOSE 94 11/03/2022 1047   BUN 16 11/03/2022 1047   CREATININE 1.04 11/03/2022 1047   CALCIUM 9.2 11/03/2022 1047   GFRNONAA >60 10/31/2021 1228    Lipid Panel     Component Value Date/Time   CHOL 138 11/03/2022 1047   TRIG 75 11/03/2022 1047   HDL 65 11/03/2022 1047   CHOLHDL 2.1 11/03/2022 1047   LDLCALC 57 11/03/2022 1047    CBC    Component Value Date/Time   WBC 4.8 11/03/2022 1047    RBC 5.38 11/03/2022 1047   HGB 16.0 11/03/2022 1047   HCT 47.5 11/03/2022 1047   PLT 249 11/03/2022 1047   MCV 88.3 11/03/2022 1047   MCH 29.7 11/03/2022 1047   MCHC 33.7 11/03/2022 1047   RDW 12.5 11/03/2022 1047    Hgb A1C Lab Results  Component Value Date   HGBA1C 6.0 (H) 04/26/2023     Assessment and Plan:  Preventative Health Maintenance:  Encouraged him to get a flu shot the fall Tdap UTD Encouraged him to get his COVID-vaccine Encouraged him to consume a balanced diet and exercise regimen Advised him to see dentist annually We will check CBC, c-Met, lipid, A1c today  RTC in 6 months, follow-up chronic conditions Helayne Lo, NP

## 2023-11-09 NOTE — Patient Instructions (Signed)
 Health Maintenance, Male  Adopting a healthy lifestyle and getting preventive care are important in promoting health and wellness. Ask your health care provider about:  The right schedule for you to have regular tests and exams.  Things you can do on your own to prevent diseases and keep yourself healthy.  What should I know about diet, weight, and exercise?  Eat a healthy diet    Eat a diet that includes plenty of vegetables, fruits, low-fat dairy products, and lean protein.  Do not eat a lot of foods that are high in solid fats, added sugars, or sodium.  Maintain a healthy weight  Body mass index (BMI) is a measurement that can be used to identify possible weight problems. It estimates body fat based on height and weight. Your health care provider can help determine your BMI and help you achieve or maintain a healthy weight.  Get regular exercise  Get regular exercise. This is one of the most important things you can do for your health. Most adults should:  Exercise for at least 150 minutes each week. The exercise should increase your heart rate and make you sweat (moderate-intensity exercise).  Do strengthening exercises at least twice a week. This is in addition to the moderate-intensity exercise.  Spend less time sitting. Even light physical activity can be beneficial.  Watch cholesterol and blood lipids  Have your blood tested for lipids and cholesterol at 34 years of age, then have this test every 5 years.  You may need to have your cholesterol levels checked more often if:  Your lipid or cholesterol levels are high.  You are older than 34 years of age.  You are at high risk for heart disease.  What should I know about cancer screening?  Many types of cancers can be detected early and may often be prevented. Depending on your health history and family history, you may need to have cancer screening at various ages. This may include screening for:  Colorectal cancer.  Prostate cancer.  Skin cancer.  Lung  cancer.  What should I know about heart disease, diabetes, and high blood pressure?  Blood pressure and heart disease  High blood pressure causes heart disease and increases the risk of stroke. This is more likely to develop in people who have high blood pressure readings or are overweight.  Talk with your health care provider about your target blood pressure readings.  Have your blood pressure checked:  Every 3-5 years if you are 9-95 years of age.  Every year if you are 85 years old or older.  If you are between the ages of 29 and 29 and are a current or former smoker, ask your health care provider if you should have a one-time screening for abdominal aortic aneurysm (AAA).  Diabetes  Have regular diabetes screenings. This checks your fasting blood sugar level. Have the screening done:  Once every three years after age 23 if you are at a normal weight and have a low risk for diabetes.  More often and at a younger age if you are overweight or have a high risk for diabetes.  What should I know about preventing infection?  Hepatitis B  If you have a higher risk for hepatitis B, you should be screened for this virus. Talk with your health care provider to find out if you are at risk for hepatitis B infection.  Hepatitis C  Blood testing is recommended for:  Everyone born from [birth year] through 1966.  Anyone  with known risk factors for hepatitis C.  Sexually transmitted infections (STIs)  You should be screened each year for STIs, including gonorrhea and chlamydia, if:  You are sexually active and are younger than 34 years of age.  You are older than 34 years of age and your health care provider tells you that you are at risk for this type of infection.  Your sexual activity has changed since you were last screened, and you are at increased risk for chlamydia or gonorrhea. Ask your health care provider if you are at risk.  Ask your health care provider about whether you are at high risk for HIV. Your health care provider  may recommend a prescription medicine to help prevent HIV infection. If you choose to take medicine to prevent HIV, you should first get tested for HIV. You should then be tested every 3 months for as long as you are taking the medicine.  Follow these instructions at home:  Alcohol use  Do not drink alcohol if your health care provider tells you not to drink.  If you drink alcohol:  Limit how much you have to 0-2 drinks a day.  Know how much alcohol is in your drink. In the [place] one drink equals one 12 oz bottle of beer (355 mL), one 5 oz glass of wine (148 mL), or one 1 oz glass of hard liquor (44 mL).  Lifestyle  Do not use any products that contain nicotine or tobacco. These products include cigarettes, chewing tobacco, and vaping devices, such as e-cigarettes. If you need help quitting, ask your health care provider.  Do not use street drugs.  Do not share needles.  Ask your health care provider for help if you need support or information about quitting drugs.  General instructions  Schedule regular health, dental, and eye exams.  Stay current with your vaccines.  Tell your health care provider if:  You often feel depressed.  You have ever been abused or do not feel safe at home.  Summary  Adopting a healthy lifestyle and getting preventive care are important in promoting health and wellness.  Follow your health care provider's instructions about healthy diet, exercising, and getting tested or screened for diseases.  Follow your health care provider's instructions on monitoring your cholesterol and blood pressure.  This information is not intended to replace advice given to you by your health care provider. Make sure you discuss any questions you have with your health care provider.  Document Revised: 11/25/2020 Document Reviewed: 11/25/2020  Elsevier Patient Education  2024 [employer].

## 2023-11-09 NOTE — Assessment & Plan Note (Signed)
 Encourage diet and exercise for weight loss

## 2023-11-10 ENCOUNTER — Encounter: Payer: Self-pay | Admitting: Internal Medicine

## 2023-11-10 LAB — LIPID PANEL
Cholesterol: 188 mg/dL (ref <200)
HDL: 75 mg/dL (ref >=40)
LDL Cholesterol (Calc): 98 mg/dL
Non-HDL Cholesterol (Calc): 113 mg/dL (ref <130)
Total CHOL/HDL Ratio: 2.5 (calc) (ref <5.0)
Triglycerides: 65 mg/dL (ref <150)

## 2023-11-10 LAB — COMPREHENSIVE METABOLIC PANEL WITH GFR
AG Ratio: 2 (calc) (ref 1.0–2.5)
ALT: 42 U/L (ref 9–46)
AST: 33 U/L (ref 10–40)
Albumin: 4.4 g/dL (ref 3.6–5.1)
Alkaline phosphatase (APISO): 62 U/L (ref 36–130)
BUN: 19 mg/dL (ref 7–25)
CO2: 29 mmol/L (ref 20–32)
Calcium: 9.3 mg/dL (ref 8.6–10.3)
Chloride: 103 mmol/L (ref 98–110)
Creat: 1.14 mg/dL (ref 0.60–1.26)
Globulin: 2.2 g/dL (ref 1.9–3.7)
Glucose, Bld: 86 mg/dL (ref 65–139)
Potassium: 4.4 mmol/L (ref 3.5–5.3)
Sodium: 140 mmol/L (ref 135–146)
Total Bilirubin: 0.5 mg/dL (ref 0.2–1.2)
Total Protein: 6.6 g/dL (ref 6.1–8.1)
eGFR: 87 mL/min/{1.73_m2} (ref >=60)

## 2023-11-10 LAB — CBC
HCT: 46.4 % (ref 38.5–50.0)
Hemoglobin: 15.7 g/dL (ref 13.2–17.1)
MCH: 29.6 pg (ref 27.0–33.0)
MCHC: 33.8 g/dL (ref 32.0–36.0)
MCV: 87.4 fL (ref 80.0–100.0)
MPV: 8.7 fL (ref 7.5–12.5)
Platelets: 231 10*3/uL (ref 140–400)
RBC: 5.31 10*6/uL (ref 4.20–5.80)
RDW: 12.2 % (ref 11.0–15.0)
WBC: 5.4 10*3/uL (ref 3.8–10.8)

## 2023-11-10 LAB — HEMOGLOBIN A1C
Hgb A1c MFr Bld: 5.9 % — ABNORMAL HIGH (ref <5.7)
Mean Plasma Glucose: 123 mg/dL
eAG (mmol/L): 6.8 mmol/L

## 2024-01-13 ENCOUNTER — Encounter: Payer: Self-pay | Admitting: Internal Medicine

## 2024-01-13 ENCOUNTER — Ambulatory Visit: Admitting: Internal Medicine

## 2024-01-13 VITALS — BP 112/70 | Ht 73.0 in | Wt 301.2 lb

## 2024-01-13 DIAGNOSIS — E66812 Obesity, class 2: Secondary | ICD-10-CM | POA: Diagnosis not present

## 2024-01-13 DIAGNOSIS — Z6839 Body mass index (BMI) 39.0-39.9, adult: Secondary | ICD-10-CM

## 2024-01-13 DIAGNOSIS — R7303 Prediabetes: Secondary | ICD-10-CM

## 2024-01-13 DIAGNOSIS — E6609 Other obesity due to excess calories: Secondary | ICD-10-CM | POA: Diagnosis not present

## 2024-01-13 MED ORDER — PHENTERMINE HCL 15 MG PO CAPS: 15.0000 mg | ORAL_CAPSULE | ORAL | 0 refills | Status: DC

## 2024-01-13 NOTE — Patient Instructions (Signed)
 Nonsurgical Weight Loss Treatment Options Nonsurgical weight loss has many different components. You will learn how to work with a specialized care team to incorporate diet, exercise, and lifestyle changes into your life. To view the content, go to this web address: [email or web address]  This video will expire on: 06/30/2025. If you need access to this video following this date, please reach out to the healthcare provider who assigned it to you. This information is not intended to replace advice given to you by your health care provider. Make sure you discuss any questions you have with your health care provider. Elsevier Patient Education  2024 [employer].

## 2024-01-13 NOTE — Progress Notes (Signed)
 HPI  Discussed the use of AI scribe software for clinical note transcription with the patient, who gave verbal consent to proceed.  Blake Perez is a 33 year old male who presents for weight loss management.  He currently weighs 301 pounds with a BMI of 39.74, indicating obesity. He has previously been on phentermine , which was discontinued around December, leading to a weight gain of approximately six to seven pounds.  He is interested in restarting phentermine  at a dose of 15 mg, as he recalls it being effective initially before increasing to 30 mg. His insurance does not cover other weight loss medications unless conditions like diabetes or sleep apnea are present.  He has been told he is prediabetic but has not been diagnosed with diabetes.       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   No current facility-administered medications for this visit.    No Known Allergies  Family History  Problem Relation Age of Onset   Healthy Mother    Cerebral aneurysm Father    Alcohol abuse Father    Scoliosis Sister    Healthy Brother    Healthy Brother    Healthy Brother    Diabetes Maternal Grandfather    Osteoarthritis Paternal Grandmother        Back issues    Social History   Socioeconomic History   Marital status: Married    Spouse name: Not on file   Number of children: 0   Years of education: Not on file   Highest education level: Associate degree: occupational, Scientist, product/process development, or vocational program  Occupational History   Not on file  Tobacco Use   Smoking status: Former    Current packs/day: 0.00    Types: Cigarettes    Quit date: 2015    Years since quitting: 10.4   Smokeless tobacco: Former    Types: Snuff  Vaping Use   Vaping status: Former  Substance and Sexual Activity   Alcohol use:  Yes    Comment: occassional   Drug use: Never   Sexual activity: Yes  Other Topics Concern   Not on file  Social History Narrative   Lives with girlfriend   Social Drivers of Health   Financial Resource Strain: Low Risk  (12/18/2022)   Overall Financial Resource Strain (CARDIA)    Difficulty of Paying Living Expenses: Not hard at all  Food Insecurity: No Food Insecurity (12/18/2022)   Hunger Vital Sign    Worried About Running Out of Food in the Last Year: Never true    Ran Out of Food in the Last Year: Never true  Transportation Needs: No Transportation Needs (12/18/2022)   PRAPARE - Administrator, Civil Service (Medical): No    Lack of Transportation (Non-Medical): No  Physical Activity: Insufficiently Active (12/18/2022)   Exercise Vital Sign    Days of Exercise per Week: 3 days    Minutes of Exercise per Session: 30 min  Stress: No Stress Concern Present (12/18/2022)   [employer] of Occupational Health - Occupational Stress Questionnaire    Feeling of Stress : Only a little  Social Connections: Moderately Integrated (12/18/2022)   Social Connection and Isolation Panel    Frequency of Communication with Friends and Family: More than three times a week  Frequency of Social Gatherings with Friends and Family: More than three times a week    Attends Religious Services: 1 to 4 times per year    Active Member of [employer] or Organizations: No    Attends Engineer, structural: Not on file    Marital Status: Married  Catering manager Violence: Not on file    ROS:  Constitutional: Patient reports abnormal weight gain.  Denies fever, malaise, fatigue, headache.  HEENT: Denies eye pain, eye redness, ear pain, ringing in the ears, wax buildup, runny nose, nasal congestion, bloody nose, or sore throat. Respiratory: Denies difficulty breathing, shortness of breath, cough or sputum production.   Cardiovascular: Denies chest pain, chest tightness, palpitations or  swelling in the hands or feet.  Musculoskeletal: Pt reports intermittent back pain. Denies decrease in range of motion, difficulty with gait, or joint swelling.  Skin: Denies redness, rashes, lesions or ulcercations.  Neurological: Denies dizziness, difficulty with memory, difficulty with speech or problems with balance and coordination.  Psych: Denies anxiety, depression, SI/HI.  No other specific complaints in a complete review of systems (except as listed in HPI above).  PE:  BP 112/70 (BP Location: Right Arm, Patient Position: Sitting, Cuff Size: Large)   Ht 6' 1 (1.854 m)   Wt (!) 301 lb 3.2 oz (136.6 kg)   BMI 39.74 kg/m    Wt Readings from Last 3 Encounters:  11/09/23 293 lb 6.4 oz (133.1 kg)  04/26/23 295 lb (133.8 kg)  12/18/22 (!) 301 lb (136.5 kg)    General: Appears his stated age, obese, in NAD. Cardiovascular: Normal rate and rhythm. S1,S2 noted.  No murmur, rubs or gallops noted. No JVD or BLE edema.  Varicose veins noted of BLE, R >L. Pulmonary/Chest: Normal effort and positive vesicular breath sounds. No respiratory distress. No wheezes, rales or ronchi noted.  Neurological: Alert and oriented.    BMET    Component Value Date/Time   NA 140 11/09/2023 1459   K 4.4 11/09/2023 1459   CL 103 11/09/2023 1459   CO2 29 11/09/2023 1459   GLUCOSE 86 11/09/2023 1459   BUN 19 11/09/2023 1459   CREATININE 1.14 11/09/2023 1459   CALCIUM 9.3 11/09/2023 1459   GFRNONAA >60 10/31/2021 1228    Lipid Panel     Component Value Date/Time   CHOL 188 11/09/2023 1459   TRIG 65 11/09/2023 1459   HDL 75 11/09/2023 1459   CHOLHDL 2.5 11/09/2023 1459   LDLCALC 98 11/09/2023 1459    CBC    Component Value Date/Time   WBC 5.4 11/09/2023 1459   RBC 5.31 11/09/2023 1459   HGB 15.7 11/09/2023 1459   HCT 46.4 11/09/2023 1459   PLT 231 11/09/2023 1459   MCV 87.4 11/09/2023 1459   MCH 29.6 11/09/2023 1459   MCHC 33.8 11/09/2023 1459   RDW 12.2 11/09/2023 1459    Hgb  A1C Lab Results  Component Value Date   HGBA1C 5.9 (H) 11/09/2023     Assessment and Plan:  Assessment and Plan    Obesity BMI 39.74. Previously effective phentermine  discontinued, resulting in weight gain. Insurance limits other medications. - Prescribe phentermine  15 mg. - Instruct to send MyChart message monthly for refills or dose adjustments. - Discuss potential dose increase to 30 mg or 37.5 mg if needed.  Prediabetes Prediabetes identified, risk for type 2 diabetes.        RTC in 4 months, follow-up chronic conditions Angeline Laura, NP

## 2024-02-18 ENCOUNTER — Encounter: Payer: Self-pay | Admitting: Internal Medicine

## 2024-02-18 MED ORDER — PHENTERMINE HCL 30 MG PO CAPS
30.0000 mg | ORAL_CAPSULE | ORAL | 0 refills | Status: DC
Start: 2024-02-18 — End: 2024-03-31

## 2024-02-18 NOTE — Addendum Note (Signed)
 Addended by: ANTONETTE ANGELINE ORN on: 02/18/2024 01:10 PM   Modules accepted: Orders

## 2024-03-31 ENCOUNTER — Encounter: Payer: Self-pay | Admitting: Internal Medicine

## 2024-03-31 MED ORDER — PHENTERMINE HCL 30 MG PO CAPS: 30.0000 mg | ORAL_CAPSULE | ORAL | 0 refills | Status: DC

## 2024-05-11 ENCOUNTER — Ambulatory Visit: Admitting: Internal Medicine

## 2024-05-26 ENCOUNTER — Ambulatory Visit: Admitting: Internal Medicine

## 2024-05-26 ENCOUNTER — Encounter: Payer: Self-pay | Admitting: Internal Medicine

## 2024-05-26 VITALS — BP 118/68 | Ht 73.0 in | Wt 304.6 lb

## 2024-05-26 DIAGNOSIS — M5440 Lumbago with sciatica, unspecified side: Secondary | ICD-10-CM | POA: Diagnosis not present

## 2024-05-26 DIAGNOSIS — R7303 Prediabetes: Secondary | ICD-10-CM | POA: Diagnosis not present

## 2024-05-26 DIAGNOSIS — E66813 Obesity, class 3: Secondary | ICD-10-CM | POA: Diagnosis not present

## 2024-05-26 DIAGNOSIS — Z6841 Body Mass Index (BMI) 40.0 and over, adult: Secondary | ICD-10-CM

## 2024-05-26 DIAGNOSIS — G8929 Other chronic pain: Secondary | ICD-10-CM | POA: Diagnosis not present

## 2024-05-26 NOTE — Progress Notes (Signed)
 Subjective:    Patient ID: Blake Perez, male    DOB: 10-27-1990, 33 y.o.   MRN: [id number]  HPI  Patient presents to clinic today for 51-month follow-up of chronic conditions.  Chronic back pain: X-ray and MRI lumbar spine 07/2021 reviewed. S/p surgical intervention 10/2021. He does not take any medications for this. He no longer follows with neurosurgery.  Prediabetes: His last A1c was 5.9%, 11/2023.  He is not taking any oral diabetic medication at this time.  He does not check his sugars.  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 30 capsule 0   No current facility-administered medications for this visit.    No Known Allergies  Family History  Problem Relation Age of Onset   Healthy Mother    Cerebral aneurysm Father    Alcohol abuse Father    Scoliosis Sister    Healthy Brother    Healthy Brother    Healthy Brother    Diabetes Maternal Grandfather    Osteoarthritis Paternal Grandmother        Back issues    Social History   Socioeconomic History   Marital status: Married    Spouse name: Not on file   Number of children: 0   Years of education: Not on file   Highest education level: Associate degree: occupational, scientist, product/process development, or vocational program  Occupational History   Not on file  Tobacco Use   Smoking status: Former    Current packs/day: 0.00    Types: Cigarettes    Quit date: 2015    Years since quitting: 10.8   Smokeless tobacco: Former    Types: Snuff  Vaping Use   Vaping status: Former  Substance and Sexual Activity   Alcohol use: Yes    Comment: occassional   Drug use: Never   Sexual activity: Yes  Other Topics Concern   Not on file  Social  History Narrative   Lives with girlfriend   Social Drivers of Health   Financial Resource Strain: Low Risk  (12/18/2022)   Overall Financial Resource Strain (CARDIA)    Difficulty of Paying Living Expenses: Not hard at all  Food Insecurity: No Food Insecurity (12/18/2022)   Hunger Vital Sign    Worried About Running Out of Food in the Last Year: Never true    Ran Out of Food in the Last Year: Never true  Transportation Needs: No Transportation Needs (12/18/2022)   PRAPARE - Administrator, Civil Service (Medical): No    Lack of Transportation (Non-Medical): No  Physical Activity: Insufficiently Active (12/18/2022)   Exercise Vital Sign    Days of Exercise per Week: 3 days    Minutes of Exercise per Session: 30 min  Stress: No Stress Concern Present (12/18/2022)   [employer] of Occupational Health - Occupational Stress Questionnaire    Feeling of Stress : Only a little  Social Connections: Moderately Integrated (12/18/2022)   Social Connection and Isolation Panel    Frequency of Communication with Friends and Family: More than three times a week    Frequency of Social Gatherings with Friends and Family: More than three times a week  Attends Religious Services: 1 to 4 times per year    Active Member of Clubs or Organizations: No    Attends Engineer, Structural: Not on file    Marital Status: Married  Catering Manager Violence: Not on file     Constitutional: Denies fever, malaise, fatigue, headache or abrupt weight changes.  HEENT: Denies eye pain, eye redness, ear pain, ringing in the ears, wax buildup, runny nose, nasal congestion, bloody nose, or sore throat. Respiratory: Denies difficulty breathing, shortness of breath, cough or sputum production.   Cardiovascular: Denies chest pain, chest tightness, palpitations or swelling in the hands or feet.  Gastrointestinal: Denies abdominal pain, bloating, constipation, diarrhea or blood in the stool.  GU:  Denies urgency, pain with urination, burning sensation, blood in urine, odor or discharge. Musculoskeletal: Patient reports chronic low back pain.  Denies decrease in range of motion, difficulty with gait, or joint swelling.  Skin: Denies redness, rashes, lesions or ulcercations.  Neurological: Denies dizziness, difficulty with memory, difficulty with speech or problems with balance and coordination.  Psych: Denies anxiety, depression, SI/HI.  No other specific complaints in a complete review of systems (except as listed in HPI above).  Objective:   Physical Exam  BP 118/68 (BP Location: Right Arm, Patient Position: Sitting, Cuff Size: Large)   Ht 6' 1 (1.854 m)   Wt (!) 304 lb 9.6 oz (138.2 kg)   BMI 40.19 kg/m    Wt Readings from Last 3 Encounters:  01/13/24 (!) 301 lb 3.2 oz (136.6 kg)  11/09/23 293 lb 6.4 oz (133.1 kg)  04/26/23 295 lb (133.8 kg)    General: Appears his stated age, obese, in NAD. Skin: Warm, dry and intact.  Cardiovascular: Normal rate and rhythm. S1,S2 noted.  No murmur, rubs or gallops noted.  Varicose veins of BLE. Pulmonary/Chest: Normal effort and positive vesicular breath sounds. No respiratory distress. No wheezes, rales or ronchi noted.  Musculoskeletal:  No difficulty with gait.  Neurological: Alert and oriented.     BMET    Component Value Date/Time   NA 140 11/09/2023 1459   K 4.4 11/09/2023 1459   CL 103 11/09/2023 1459   CO2 29 11/09/2023 1459   GLUCOSE 86 11/09/2023 1459   BUN 19 11/09/2023 1459   CREATININE 1.14 11/09/2023 1459   CALCIUM 9.3 11/09/2023 1459   GFRNONAA >60 10/31/2021 1228    Lipid Panel     Component Value Date/Time   CHOL 188 11/09/2023 1459   TRIG 65 11/09/2023 1459   HDL 75 11/09/2023 1459   CHOLHDL 2.5 11/09/2023 1459   LDLCALC 98 11/09/2023 1459    CBC    Component Value Date/Time   WBC 5.4 11/09/2023 1459   RBC 5.31 11/09/2023 1459   HGB 15.7 11/09/2023 1459   HCT 46.4 11/09/2023 1459   PLT 231  11/09/2023 1459   MCV 87.4 11/09/2023 1459   MCH 29.6 11/09/2023 1459   MCHC 33.8 11/09/2023 1459   RDW 12.2 11/09/2023 1459    Hgb A1C Lab Results  Component Value Date   HGBA1C 5.9 (H) 11/09/2023           Assessment & Plan:     RTC in 6 months for your annual exam Angeline Laura, NP

## 2024-05-26 NOTE — Assessment & Plan Note (Signed)
 Complicated by morbid obesity Encouraged weight loss as this can help reduce back pain Encouraged regular stretching and core strengthening Can take Tylenol  or ibuprofen  OTC as needed

## 2024-05-26 NOTE — Assessment & Plan Note (Signed)
 Encourage diet and exercise for weight loss

## 2024-05-26 NOTE — Assessment & Plan Note (Signed)
 Complicated by morbid obesity A1c today Encourage low-carb diet and exercise for weight

## 2024-05-26 NOTE — Patient Instructions (Signed)

## 2024-07-03 ENCOUNTER — Telehealth

## 2024-07-03 DIAGNOSIS — J069 Acute upper respiratory infection, unspecified: Secondary | ICD-10-CM

## 2024-07-03 MED ORDER — ALBUTEROL SULFATE HFA 108 (90 BASE) MCG/ACT IN AERS: 1.0000 | INHALATION_SPRAY | Freq: Four times a day (QID) | RESPIRATORY_TRACT | 0 refills | Status: AC | PRN

## 2024-07-03 MED ORDER — PSEUDOEPH-BROMPHEN-DM 30-2-10 MG/5ML PO SYRP: 5.0000 mL | ORAL_SOLUTION | Freq: Four times a day (QID) | ORAL | 0 refills | Status: AC | PRN

## 2024-07-03 MED ORDER — PREDNISONE 10 MG (21) PO TBPK: ORAL_TABLET | ORAL | 0 refills | Status: AC

## 2024-07-03 NOTE — Progress Notes (Signed)
 Virtual Visit Consent   Blake Perez, you are scheduled for a virtual visit with a [facility] provider today. Just as with appointments in the office, your consent must be obtained to participate. Your consent will be active for this visit and any virtual visit you may have with one of our providers in the next 365 days. If you have a MyChart account, a copy of this consent can be sent to you electronically.  As this is a virtual visit, video technology does not allow for your provider to perform a traditional examination. This may limit your provider's ability to fully assess your condition. If your provider identifies any concerns that need to be evaluated in person or the need to arrange testing (such as labs, EKG, etc.), we will make arrangements to do so. Although advances in technology are sophisticated, we cannot ensure that it will always work on either your end or our end. If the connection with a video visit is poor, the visit may have to be switched to a telephone visit. With either a video or telephone visit, we are not always able to ensure that we have a secure connection.  By engaging in this virtual visit, you consent to the provision of healthcare and authorize for your insurance to be billed (if applicable) for the services provided during this visit. Depending on your insurance coverage, you may receive a charge related to this service.  I need to obtain your verbal consent now. Are you willing to proceed with your visit today? Blake Perez has provided verbal consent on 07/03/2024 for a virtual visit (video or telephone). [name], PA-C  Date: 07/03/2024 9:10 AM   Virtual Visit via Video Note   I, [name], connected with  Blake Perez  ([id number], 34-03-1991) on 07/03/2024 at  9:00 AM EST by a video-enabled telemedicine application and verified that I am speaking with the correct person using two identifiers.  Location: Patient: Virtual  Visit Location Patient: Mobile Provider: Virtual Visit Location Provider: Home Office   I discussed the limitations of evaluation and management by telemedicine and the availability of in person appointments. The patient expressed understanding and agreed to proceed.    History of Present Illness: Blake Perez is a 33 y.o. who identifies as a male who was assigned male at birth, and is being seen today for cough and congestion.  HPI: URI  This is a new problem. The current episode started in the past 7 days (worsened over last 4 days). The problem has been gradually worsening. There has been no fever. Associated symptoms include congestion, coughing (more productive at night and first thing in the morning), headaches, a plugged ear sensation, rhinorrhea, sinus pain (pressure, not pain), a sore throat (initial, but improving) and wheezing (in the evenings). Pertinent negatives include no diarrhea, ear pain, nausea, sneezing or vomiting. He has tried decongestant (mucinex, sudafed) for the symptoms. The treatment provided no relief.    Problems:  Patient Active Problem List   Diagnosis Date Noted   Prediabetes 12/18/2022   Chronic back pain 11/03/2022   Class 3 severe obesity due to excess calories with body mass index (BMI) of 40.0 to 44.9 in adult [facility]) 11/03/2022    Allergies: Allergies[1] Medications: Current Medications[2]  Observations/Objective: Patient is well-developed, well-nourished in no acute distress.  Resting comfortably  Head is normocephalic, atraumatic.  No labored breathing.  Speech is clear and coherent with logical content.  Patient is alert and oriented  at baseline.    Assessment and Plan: 1. Viral URI with cough (Primary) - predniSONE  (STERAPRED UNI-PAK 21 TAB) 10 MG (21) TBPK tablet; 6 day taper, take as directed on package instructions  Dispense: 21 tablet; Refill: 0 - brompheniramine-pseudoephedrine-DM 30-2-10 MG/5ML syrup; Take 5 mLs by mouth 4 (four)  times daily as needed.  Dispense: 120 mL; Refill: 0 - albuterol  (VENTOLIN  HFA) 108 (90 Base) MCG/ACT inhaler; Inhale 1-2 puffs into the lungs every 6 (six) hours as needed.  Dispense: 8 g; Refill: 0  - Suspect viral URI - Symptomatic medications of choice over the counter as needed - Add Prednisone  for congestion - Add Albuterol  for wheezing - Add Bromfed DM for cough and congestion - Push fluids - Rest - Seek further evaluation if symptoms change or worsen   Follow Up Instructions: I discussed the assessment and treatment plan with the patient. The patient was provided an opportunity to ask questions and all were answered. The patient agreed with the plan and demonstrated an understanding of the instructions.  A copy of instructions were sent to the patient via MyChart unless otherwise noted below.    The patient was advised to call back or seek an in-person evaluation if the symptoms worsen or if the condition fails to improve as anticipated.    Delon HERO Brileigh Sevcik, PA-C     [1] No Known Allergies [2]  Current Outpatient Medications:    albuterol  (VENTOLIN  HFA) 108 (90 Base) MCG/ACT inhaler, Inhale 1-2 puffs into the lungs every 6 (six) hours as needed., Disp: 8 g, Rfl: 0   brompheniramine-pseudoephedrine-DM 30-2-10 MG/5ML syrup, Take 5 mLs by mouth 4 (four) times daily as needed., Disp: 120 mL, Rfl: 0   predniSONE  (STERAPRED UNI-PAK 21 TAB) 10 MG (21) TBPK tablet, 6 day taper, take as directed on package instructions, Disp: 21 tablet, Rfl: 0

## 2024-07-03 NOTE — Patient Instructions (Signed)
 Blake Perez, thank you for joining Blake CHRISTELLA Dickinson, PA-C for today's virtual visit.  While this provider is not your primary care provider (PCP), if your PCP is located in our provider database this encounter information will be shared with them immediately following your visit.   A [facility] MyChart account gives you access to today's visit and all your visits, tests, and labs performed at [facility]  click here if you don't have a [facility] MyChart account or go to [email or web address]  Consent: (Patient) Blake Perez provided verbal consent for this virtual visit at the beginning of the encounter.  Current Medications:  Current Outpatient Medications:    albuterol  (VENTOLIN  HFA) 108 (90 Base) MCG/ACT inhaler, Inhale 1-2 puffs into the lungs every 6 (six) hours as needed., Disp: 8 g, Rfl: 0   brompheniramine-pseudoephedrine-DM 30-2-10 MG/5ML syrup, Take 5 mLs by mouth 4 (four) times daily as needed., Disp: 120 mL, Rfl: 0   predniSONE  (STERAPRED UNI-PAK 21 TAB) 10 MG (21) TBPK tablet, 6 day taper, take as directed on package instructions, Disp: 21 tablet, Rfl: 0   Medications ordered in this encounter:  Meds ordered this encounter  Medications   predniSONE  (STERAPRED UNI-PAK 21 TAB) 10 MG (21) TBPK tablet    Sig: 6 day taper, take as directed on package instructions    Dispense:  21 tablet    Refill:  0    Supervising Provider:   LAMPTEY, PHILIP O [[id number]]   brompheniramine-pseudoephedrine-DM 30-2-10 MG/5ML syrup    Sig: Take 5 mLs by mouth 4 (four) times daily as needed.    Dispense:  120 mL    Refill:  0    Supervising Provider:   LAMPTEY, PHILIP O [[id number]]   albuterol  (VENTOLIN  HFA) 108 (90 Base) MCG/ACT inhaler    Sig: Inhale 1-2 puffs into the lungs every 6 (six) hours as needed.    Dispense:  8 g    Refill:  0    Supervising Provider:   BLAISE ALEENE KIDD [[id number]]     *If you need refills on other medications prior to your next  appointment, please contact your pharmacy*  Follow-Up: Call back or seek an in-person evaluation if the symptoms worsen or if the condition fails to improve as anticipated.   Virtual Care [phone]  Other Instructions Upper Respiratory Infection, Adult An upper respiratory infection (URI) is a common viral infection of the nose, throat, and upper air passages that lead to the lungs. The most common type of URI is the common cold. URIs usually get better on their own, without medical treatment. What are the causes? A URI is caused by a virus. You may catch a virus by: Breathing in droplets from an infected person's cough or sneeze. Touching something that has been exposed to the virus (is contaminated) and then touching your mouth, nose, or eyes. What increases the risk? You are more likely to get a URI if: You are very young or very old. You have close contact with others, such as at work, school, or a health care facility. You smoke. You have long-term (chronic) heart or lung disease. You have a weakened disease-fighting system (immune system). You have nasal allergies or asthma. You are experiencing a lot of stress. You have poor nutrition. What are the signs or symptoms? A URI usually involves some of the following symptoms: Runny or stuffy (congested) nose. Cough. Sneezing. Sore throat. Headache. Fatigue. Fever. Loss of appetite. Pain in your forehead, behind your  eyes, and over your cheekbones (sinus pain). Muscle aches. Redness or irritation of the eyes. Pressure in the ears or face. How is this diagnosed? This condition may be diagnosed based on your medical history and symptoms, and a physical exam. Your health care provider may use a swab to take a mucus sample from your nose (nasal swab). This sample can be tested to determine what virus is causing the illness. How is this treated? URIs usually get better on their own within 7-10 days. Medicines  cannot cure URIs, but your health care provider may recommend certain medicines to help relieve symptoms, such as: Over-the-counter cold medicines. Cough suppressants. Coughing is a type of defense against infection that helps to clear the respiratory system, so take these medicines only as recommended by your health care provider. Fever-reducing medicines. Follow these instructions at home: Activity Rest as needed. If you have a fever, stay home from work or school until your fever is gone or until your health care provider says your URI cannot spread to other people (is no longer contagious). Your health care provider may have you wear a face mask to prevent your infection from spreading. Relieving symptoms Gargle with a mixture of salt and water 3-4 times a day or as needed. To make salt water, completely dissolve -1 tsp (3-6 g) of salt in 1 cup (237 mL) of warm water. Use a cool-mist humidifier to add moisture to the air. This can help you breathe more easily. Eating and drinking  Drink enough fluid to keep your urine pale yellow. Eat soups and other clear broths. General instructions  Take over-the-counter and prescription medicines only as told by your health care provider. These include cold medicines, fever reducers, and cough suppressants. Do not use any products that contain nicotine or tobacco. These products include cigarettes, chewing tobacco, and vaping devices, such as e-cigarettes. If you need help quitting, ask your health care provider. Stay away from secondhand smoke. Stay up to date on all immunizations, including the yearly (annual) flu vaccine. Keep all follow-up visits. This is important. How to prevent the spread of infection to others URIs can be contagious. To prevent the infection from spreading: Wash your hands with soap and water for at least 20 seconds. If soap and water are not available, use hand sanitizer. Avoid touching your mouth, face, eyes, or  nose. Cough or sneeze into a tissue or your sleeve or elbow instead of into your hand or into the air.  Contact a health care provider if: You are getting worse instead of better. You have a fever or chills. Your mucus is brown or red. You have yellow or brown discharge coming from your nose. You have pain in your face, especially when you bend forward. You have swollen neck glands. You have pain while swallowing. You have white areas in the back of your throat. Get help right away if: You have shortness of breath that gets worse. You have severe or persistent: Headache. Ear pain. Sinus pain. Chest pain. You have chronic lung disease along with any of the following: Making high-pitched whistling sounds when you breathe, most often when you breathe out (wheezing). Prolonged cough (more than 14 days). Coughing up blood. A change in your usual mucus. You have a stiff neck. You have changes in your: Vision. Hearing. Thinking. Mood. These symptoms may be an emergency. Get help right away. Call 911. Do not wait to see if the symptoms will go away. Do not drive yourself to the  hospital. Summary An upper respiratory infection (URI) is a common infection of the nose, throat, and upper air passages that lead to the lungs. A URI is caused by a virus. URIs usually get better on their own within 7-10 days. Medicines cannot cure URIs, but your health care provider may recommend certain medicines to help relieve symptoms. This information is not intended to replace advice given to you by your health care provider. Make sure you discuss any questions you have with your health care provider. Document Revised: 02/05/2021 Document Reviewed: 02/05/2021 Elsevier Patient Education  2024 Elsevier Inc.   If you have been instructed to have an in-person evaluation today at a local Urgent Care facility, please use the link below. It will take you to a list of all of our available [facility], including address, phone number and hours of operation. Please do not delay care.  [facility]  If you or a family member do not have a primary care provider, use the link below to schedule a visit and establish care. When you choose a [facility] primary care physician or advanced practice provider, you gain a long-term partner in health. Find a Primary Care Provider  Learn more about [place]'s in-office and virtual care options: [facility] - Get Care Now

## 2024-07-15 ENCOUNTER — Telehealth: Admitting: Nurse Practitioner

## 2024-07-15 DIAGNOSIS — B9689 Other specified bacterial agents as the cause of diseases classified elsewhere: Secondary | ICD-10-CM

## 2024-07-15 DIAGNOSIS — J019 Acute sinusitis, unspecified: Secondary | ICD-10-CM | POA: Diagnosis not present

## 2024-07-15 MED ORDER — AMOXICILLIN-POT CLAVULANATE 875-125 MG PO TABS: 1.0000 | ORAL_TABLET | Freq: Two times a day (BID) | ORAL | 0 refills | Status: AC

## 2024-07-15 NOTE — Patient Instructions (Signed)
" °  [name], thank you for joining Haze LELON Servant, NP for today's virtual visit.  While this provider is not your primary care provider (PCP), if your PCP is located in our provider database this encounter information will be shared with them immediately following your visit.   A [facility] MyChart account gives you access to today's visit and all your visits, tests, and labs performed at [facility]  click here if you don't have a [facility] MyChart account or go to [email or web address]  Consent: (Patient) Blake Perez provided verbal consent for this virtual visit at the beginning of the encounter.  Current Medications:  Current Outpatient Medications:    amoxicillin -clavulanate (AUGMENTIN ) 875-125 MG tablet, Take 1 tablet by mouth 2 (two) times daily for 7 days., Disp: 14 tablet, Rfl: 0   albuterol  (VENTOLIN  HFA) 108 (90 Base) MCG/ACT inhaler, Inhale 1-2 puffs into the lungs every 6 (six) hours as needed., Disp: 8 g, Rfl: 0   brompheniramine-pseudoephedrine-DM 30-2-10 MG/5ML syrup, Take 5 mLs by mouth 4 (four) times daily as needed., Disp: 120 mL, Rfl: 0   predniSONE  (STERAPRED UNI-PAK 21 TAB) 10 MG (21) TBPK tablet, 6 day taper, take as directed on package instructions, Disp: 21 tablet, Rfl: 0   Medications ordered in this encounter:  Meds ordered this encounter  Medications   amoxicillin -clavulanate (AUGMENTIN ) 875-125 MG tablet    Sig: Take 1 tablet by mouth 2 (two) times daily for 7 days.    Dispense:  14 tablet    Refill:  0    Supervising Provider:   BLAISE ALEENE KIDD [[id number]]     *If you need refills on other medications prior to your next appointment, please contact your pharmacy*  Follow-Up: Call back or seek an in-person evaluation if the symptoms worsen or if the condition fails to improve as anticipated.  [facility] [phone]  Other Instructions INSTRUCTIONS: use a humidifier for nasal congestion Drink plenty of  fluids, rest and wash hands frequently to avoid the spread of infection Alternate tylenol  and Motrin  for relief of fever    If you have been instructed to have an in-person evaluation today at a local Urgent Care facility, please use the link below. It will take you to a list of all of our available [facility], including address, phone number and hours of operation. Please do not delay care.  [facility]  If you or a family member do not have a primary care provider, use the link below to schedule a visit and establish care. When you choose a [facility] primary care physician or advanced practice provider, you gain a long-term partner in health. Find a Primary Care Provider  Learn more about [facility]'s in-office and virtual care options:  - Get Care Now  "

## 2024-07-15 NOTE — Progress Notes (Signed)
 " Virtual Visit Consent   Blake Perez, you are scheduled for a virtual visit with a [facility] provider today. Just as with appointments in the office, your consent must be obtained to participate. Your consent will be active for this visit and any virtual visit you may have with one of our providers in the next 365 days. If you have a MyChart account, a copy of this consent can be sent to you electronically.  As this is a virtual visit, video technology does not allow for your provider to perform a traditional examination. This may limit your provider's ability to fully assess your condition. If your provider identifies any concerns that need to be evaluated in person or the need to arrange testing (such as labs, EKG, etc.), we will make arrangements to do so. Although advances in technology are sophisticated, we cannot ensure that it will always work on either your end or our end. If the connection with a video visit is poor, the visit may have to be switched to a telephone visit. With either a video or telephone visit, we are not always able to ensure that we have a secure connection.  By engaging in this virtual visit, you consent to the provision of healthcare and authorize for your insurance to be billed (if applicable) for the services provided during this visit. Depending on your insurance coverage, you may receive a charge related to this service.  I need to obtain your verbal consent now. Are you willing to proceed with your visit today? Blake Perez has provided verbal consent on 07/15/2024 for a virtual visit (video or telephone). Blake LELON Servant, NP  Date: 07/15/2024 12:21 PM   Virtual Visit via Video Note   I, Blake Perez, connected with  Blake Perez  ([id number], [birth year]) on 07/15/2024 at 12:15 PM EST by a video-enabled telemedicine application and verified that I am speaking with the correct person using two identifiers.  Location: Patient: Virtual Visit  Location Patient: Home Provider: Virtual Visit Location Provider: Home Office   I discussed the limitations of evaluation and management by telemedicine and the availability of in person appointments. The patient expressed understanding and agreed to proceed.    History of Present Illness: Blake Perez is a 33 y.o. who identifies as a male who was assigned male at birth, and is being seen today for sinus symptoms .    Blake Perez has been experiencing URI symptoms for the past 12 days.  He had a virtual visit on December 15 and at that time was prescribed steroids, cough syrup and an albuterol  inhaler.  Despite using these medications and completing prednisone  his symptoms have persisted and have evolved into significant sinus pressure and pain on the entire right side of the face along with cough and congestion.  He is using a steroid nasal spray with no relief.   Problems:  Patient Active Problem List   Diagnosis Date Noted   Prediabetes 12/18/2022   Chronic back pain 11/03/2022   Class 3 severe obesity due to excess calories with body mass index (BMI) of 40.0 to 44.9 in adult [facility]) 11/03/2022    Allergies: Allergies[1] Medications: Current Medications[2]  Observations/Objective: Patient is well-developed, well-nourished in no acute distress.  Resting comfortably at home.  Head is normocephalic, atraumatic.  No labored breathing.  Speech is clear and coherent with logical content.  Patient is alert and oriented at baseline.    Assessment and Plan: 1. Acute bacterial sinusitis (Primary) -  amoxicillin -clavulanate (AUGMENTIN ) 875-125 MG tablet; Take 1 tablet by mouth 2 (two) times daily for 7 days.  Dispense: 14 tablet; Refill: 0   Follow Up Instructions: I discussed the assessment and treatment plan with the patient. The patient was provided an opportunity to ask questions and all were answered. The patient agreed with the plan and demonstrated an understanding of the  instructions.  A copy of instructions were sent to the patient via MyChart unless otherwise noted below.     The patient was advised to call back or seek an in-person evaluation if the symptoms worsen or if the condition fails to improve as anticipated.    Blake LELON Servant, NP     [1] No Known Allergies [2]  Current Outpatient Medications:    amoxicillin -clavulanate (AUGMENTIN ) 875-125 MG tablet, Take 1 tablet by mouth 2 (two) times daily for 7 days., Disp: 14 tablet, Rfl: 0   albuterol  (VENTOLIN  HFA) 108 (90 Base) MCG/ACT inhaler, Inhale 1-2 puffs into the lungs every 6 (six) hours as needed., Disp: 8 g, Rfl: 0   brompheniramine-pseudoephedrine-DM 30-2-10 MG/5ML syrup, Take 5 mLs by mouth 4 (four) times daily as needed., Disp: 120 mL, Rfl: 0   predniSONE  (STERAPRED UNI-PAK 21 TAB) 10 MG (21) TBPK tablet, 6 day taper, take as directed on package instructions, Disp: 21 tablet, Rfl: 0  "

## 2024-11-13 ENCOUNTER — Encounter: Admitting: Internal Medicine
